# Patient Record
Sex: Female | Born: 1995
Health system: Southern US, Community
[De-identification: ages and names within clinical notes are randomized; demographics above are authoritative.]

## PROBLEM LIST (undated history)

## (undated) DIAGNOSIS — J45909 Unspecified asthma, uncomplicated: Secondary | ICD-10-CM

## (undated) DIAGNOSIS — R51 Headache: Secondary | ICD-10-CM

## (undated) HISTORY — DX: Headache: R51

---
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
## 2015-10-30 ENCOUNTER — Ambulatory Visit: Payer: Self-pay | Admitting: Family Medicine

## 2015-11-02 ENCOUNTER — Ambulatory Visit (INDEPENDENT_AMBULATORY_CARE_PROVIDER_SITE_OTHER): Payer: Medicaid Other | Admitting: Family Medicine

## 2015-11-02 ENCOUNTER — Encounter: Payer: Self-pay | Admitting: Family Medicine

## 2015-11-02 VITALS — BP 114/68 | HR 86 | Temp 97.4°F | Resp 16 | Ht 69.0 in | Wt 171.0 lb

## 2015-11-02 DIAGNOSIS — R635 Abnormal weight gain: Secondary | ICD-10-CM | POA: Diagnosis not present

## 2015-11-02 DIAGNOSIS — J4520 Mild intermittent asthma, uncomplicated: Secondary | ICD-10-CM | POA: Diagnosis not present

## 2015-11-02 DIAGNOSIS — R5383 Other fatigue: Secondary | ICD-10-CM | POA: Diagnosis not present

## 2015-11-02 LAB — COMPLETE METABOLIC PANEL WITH GFR
ALBUMIN: 4.1 g/dL (ref 3.6–5.1)
ALK PHOS: 91 U/L (ref 47–176)
ALT: 7 U/L (ref 5–32)
AST: 16 U/L (ref 12–32)
BILIRUBIN TOTAL: 0.3 mg/dL (ref 0.2–1.1)
BUN: 12 mg/dL (ref 7–20)
CALCIUM: 9.6 mg/dL (ref 8.9–10.4)
CO2: 27 mmol/L (ref 20–31)
Chloride: 100 mmol/L (ref 98–110)
Creat: 0.65 mg/dL (ref 0.50–1.00)
GFR, Est African American: >89 mL/min (ref >=60)
Glucose, Bld: 117 mg/dL — ABNORMAL HIGH (ref 65–99)
POTASSIUM: 4 mmol/L (ref 3.8–5.1)
Sodium: 137 mmol/L (ref 135–146)
TOTAL PROTEIN: 7.7 g/dL (ref 6.3–8.2)

## 2015-11-02 LAB — HEMOGLOBIN A1C
HEMOGLOBIN A1C: 5.2 % (ref <5.7)
Mean Plasma Glucose: 103 mg/dL (ref <117)

## 2015-11-02 LAB — POCT URINALYSIS DIP (DEVICE)
Bilirubin Urine: NEGATIVE
Glucose, UA: NEGATIVE mg/dL
Hgb urine dipstick: NEGATIVE
Ketones, ur: NEGATIVE mg/dL
LEUKOCYTES UA: NEGATIVE
Nitrite: NEGATIVE
Protein, ur: 30 mg/dL — AB
Specific Gravity, Urine: 1.025 (ref 1.005–1.030)
Urobilinogen, UA: 0.2 mg/dL (ref 0.0–1.0)
pH: 7 (ref 5.0–8.0)

## 2015-11-02 LAB — CBC WITH DIFFERENTIAL/PLATELET
BASOS ABS: 0 10*3/uL (ref 0.0–0.1)
Basophils Relative: 0 % (ref 0–1)
Eosinophils Absolute: 0.1 10*3/uL (ref 0.0–0.7)
Eosinophils Relative: 1 % (ref 0–5)
HEMATOCRIT: 37.5 % (ref 36.0–46.0)
Hemoglobin: 11.5 g/dL — ABNORMAL LOW (ref 12.0–15.0)
LYMPHS ABS: 1.8 10*3/uL (ref 0.7–4.0)
LYMPHS PCT: 28 % (ref 12–46)
MCH: 27.8 pg (ref 26.0–34.0)
MCHC: 30.7 g/dL (ref 30.0–36.0)
MCV: 90.6 fL (ref 78.0–100.0)
MPV: 11.9 fL (ref 8.6–12.4)
Monocytes Absolute: 0.4 10*3/uL (ref 0.1–1.0)
Monocytes Relative: 6 % (ref 3–12)
NEUTROS PCT: 65 % (ref 43–77)
Neutro Abs: 4.2 10*3/uL (ref 1.7–7.7)
Platelets: 330 10*3/uL (ref 150–400)
RBC: 4.14 MIL/uL (ref 3.87–5.11)
RDW: 13.1 % (ref 11.5–15.5)
WBC: 6.5 10*3/uL (ref 4.0–10.5)

## 2015-11-02 MED ORDER — ALBUTEROL SULFATE HFA 108 (90 BASE) MCG/ACT IN AERS: 2.0000 | INHALATION_SPRAY | Freq: Four times a day (QID) | RESPIRATORY_TRACT | Status: DC | PRN

## 2015-11-02 NOTE — Patient Instructions (Addendum)
Fatigue Fatigue is feeling tired all of the time, a lack of energy, or a lack of motivation. Occasional or mild fatigue is often a normal response to activity or life in general. However, long-lasting (chronic) or extreme fatigue may indicate an underlying medical condition. HOME CARE INSTRUCTIONS  Watch your fatigue for any changes. The following actions may help to lessen any discomfort you are feeling:  Talk to your health care provider about how much sleep you need each night. Try to get the required amount every night.  Take medicines only as directed by your health care provider.  Eat a healthy and nutritious diet. Ask your health care provider if you need help changing your diet.  Drink enough fluid to keep your urine clear or pale yellow.  Practice ways of relaxing, such as yoga, meditation, massage therapy, or acupuncture.  Exercise regularly.   Change situations that cause you stress. Try to keep your work and personal routine reasonable.  Do not abuse illegal drugs.  Limit alcohol intake to no more than 1 drink per day for nonpregnant women and 2 drinks per day for men. One drink equals 12 ounces of beer, 5 ounces of wine, or 1 ounces of hard liquor.  Take a multivitamin, if directed by your health care provider. SEEK MEDICAL CARE IF:   Your fatigue does not get better.  You have a fever.   You have unintentional weight loss or gain.  You have headaches.   You have difficulty:   Falling asleep.  Sleeping throughout the night.  You feel angry, guilty, anxious, or sad.   You are unable to have a bowel movement (constipation).   You skin is dry.   Your legs or another part of your body is swollen.  SEEK IMMEDIATE MEDICAL CARE IF:   You feel confused.   Your vision is blurry.  You feel faint or pass out.   You have a severe headache.   You have severe abdominal, pelvic, or back pain.   You have chest pain, shortness of breath, or an  irregular or fast heartbeat.   You are unable to urinate or you urinate less than normal.   You develop abnormal bleeding, such as bleeding from the rectum, vagina, nose, lungs, or nipples.  You vomit blood.   You have thoughts about harming yourself or committing suicide.   You are worried that you might harm someone else.    This information is not intended to replace advice given to you by your health care provider. Make sure you discuss any questions you have with your health care provider.   Document Released: 06/01/2007 Document Revised: 08/25/2014 Document Reviewed: 12/06/2013 Elsevier Interactive Patient Education 2016 Elsevier Inc. Medroxyprogesterone injection [Contraceptive] What is this medicine? MEDROXYPROGESTERONE (me DROX ee proe JES te rone) contraceptive injections prevent pregnancy. They provide effective birth control for 3 months. Depo-subQ Provera 104 is also used for treating pain related to endometriosis. This medicine may be used for other purposes; ask your health care provider or pharmacist if you have questions. What should I tell my health care provider before I take this medicine? They need to know if you have any of these conditions: -frequently drink alcohol -asthma -blood vessel disease or a history of a blood clot in the lungs or legs -bone disease such as osteoporosis -breast cancer -diabetes -eating disorder (anorexia nervosa or bulimia) -high blood pressure -HIV infection or AIDS -kidney disease -liver disease -mental depression -migraine -seizures (convulsions) -stroke -tobacco smoker -vaginal  bleeding -an unusual or allergic reaction to medroxyprogesterone, other hormones, medicines, foods, dyes, or preservatives -pregnant or trying to get pregnant -breast-feeding How should I use this medicine? Depo-Provera Contraceptive injection is given into a muscle. Depo-subQ Provera 104 injection is given under the skin. These injections  are given by a health care professional. You must not be pregnant before getting an injection. The injection is usually given during the first 5 days after the start of a menstrual period or 6 weeks after delivery of a baby. Talk to your pediatrician regarding the use of this medicine in children. Special care may be needed. These injections have been used in female children who have started having menstrual periods. Overdosage: If you think you have taken too much of this medicine contact a poison control center or emergency room at once. NOTE: This medicine is only for you. Do not share this medicine with others. What if I miss a dose? Try not to miss a dose. You must get an injection once every 3 months to maintain birth control. If you cannot keep an appointment, call and reschedule it. If you wait longer than 13 weeks between Depo-Provera contraceptive injections or longer than 14 weeks between Depo-subQ Provera 104 injections, you could get pregnant. Use another method for birth control if you miss your appointment. You may also need a pregnancy test before receiving another injection. What may interact with this medicine? Do not take this medicine with any of the following medications: -bosentan This medicine may also interact with the following medications: -aminoglutethimide -antibiotics or medicines for infections, especially rifampin, rifabutin, rifapentine, and griseofulvin -aprepitant -barbiturate medicines such as phenobarbital or primidone -bexarotene -carbamazepine -medicines for seizures like ethotoin, felbamate, oxcarbazepine, phenytoin, topiramate -modafinil -St. John's wort This list may not describe all possible interactions. Give your health care provider a list of all the medicines, herbs, non-prescription drugs, or dietary supplements you use. Also tell them if you smoke, drink alcohol, or use illegal drugs. Some items may interact with your medicine. What should I watch  for while using this medicine? This drug does not protect you against HIV infection (AIDS) or other sexually transmitted diseases. Use of this product may cause you to lose calcium from your bones. Loss of calcium may cause weak bones (osteoporosis). Only use this product for more than 2 years if other forms of birth control are not right for you. The longer you use this product for birth control the more likely you will be at risk for weak bones. Ask your health care professional how you can keep strong bones. You may have a change in bleeding pattern or irregular periods. Many females stop having periods while taking this drug. If you have received your injections on time, your chance of being pregnant is very low. If you think you may be pregnant, see your health care professional as soon as possible. Tell your health care professional if you want to get pregnant within the next year. The effect of this medicine may last a long time after you get your last injection. What side effects may I notice from receiving this medicine? Side effects that you should report to your doctor or health care professional as soon as possible: -allergic reactions like skin rash, itching or hives, swelling of the face, lips, or tongue -breast tenderness or discharge -breathing problems -changes in vision -depression -feeling faint or lightheaded, falls -fever -pain in the abdomen, chest, groin, or leg -problems with balance, talking, walking -unusually weak or  tired -yellowing of the eyes or skin Side effects that usually do not require medical attention (report to your doctor or health care professional if they continue or are bothersome): -acne -fluid retention and swelling -headache -irregular periods, spotting, or absent periods -temporary pain, itching, or skin reaction at site where injected -weight gain This list may not describe all possible side effects. Call your doctor for medical advice about  side effects. You may report side effects to FDA at 1-800-FDA-1088. Where should I keep my medicine? This does not apply. The injection will be given to you by a health care professional. NOTE: This sheet is a summary. It may not cover all possible information. If you have questions about this medicine, talk to your doctor, pharmacist, or health care provider.    2016, Elsevier/Gold Standard. (2008-08-25 18:37:56)

## 2015-11-02 NOTE — Progress Notes (Signed)
Subjective:    Patient ID: Julie Moody, female    DOB: 10/08/1995, 20 y.o.   MRN: [id number]  HPI  Ms. Ailene ArdsLaura Morina, a 20 year old patient that presents to establish care. She says that she was a patient [facility]. She states that she has not been followed in greater than 1 year. 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   Patient is currently complaining of fatigue.  Symptoms began several weeks ago. Sentinal symptom the patient feels fatigue began with: weight gain of 15 pounds. Symptoms of her fatigue have been intermittent. Patient describes the following psychologic symptoms: stress at school. Symptoms have been intermittent. Severity has been symptoms bothersome, but easily able to carry out all usual work/school/family activities.   Patient also has a history of asthma. presents for evaluation of asthma. Patient states that she has not had an asthma exacerbation since high school. Patient is currently asymptomatic. . Medications used in the past to treat these symptoms include beta agonist inhalers. Suspected precipitants include exercise.  Past Medical History  Diagnosis Date  . Headache(784.0)     Immunization History  Administered Date(s) Administered  . Influenza,inj,Quad PF,36+ Mos 09/07/2014    Social History   Social History  . Marital Status: Single    Spouse Name: N/A  . Number of Children: N/A  . Years of Education: N/A   Occupational History  . Not on file.   Social History Main Topics  . Smoking status: Never Smoker   . Smokeless tobacco: Never Used  . Alcohol Use: No  . Drug Use: No  . Sexual Activity:    Partners: Male   Other Topics Concern  . Not on file   Social History Narrative   Review of Systems  Constitutional: Negative.   HENT: Negative.   Eyes: Negative.   Respiratory: Negative.   Cardiovascular: Negative.   Gastrointestinal: Negative.    Endocrine: Negative.  Negative for polydipsia, polyphagia and polyuria.  Genitourinary: Negative.   Musculoskeletal: Negative.   Skin: Negative.   Allergic/Immunologic: Negative.   Neurological: Negative.   Hematological: Negative.   Psychiatric/Behavioral: Negative.       Objective:   Physical Exam  Constitutional: She is oriented to person, place, and time. She appears well-developed and well-nourished.  HENT:  Head: Normocephalic and atraumatic.  Right Ear: External ear normal.  Left Ear: External ear normal.  Mouth/Throat: Oropharynx is clear and moist.  Eyes: Conjunctivae and EOM are normal. Pupils are equal, round, and reactive to light.  Neck: Normal range of motion. Neck supple.  Cardiovascular: Normal rate, regular rhythm, normal heart sounds and intact distal pulses.   Pulmonary/Chest: Effort normal and breath sounds normal.  Abdominal: Soft. Bowel sounds are normal.  Musculoskeletal: Normal range of motion.  Neurological: She is alert and oriented to person, place, and time. She has normal reflexes.  Skin: Skin is warm and dry.  Psychiatric: She has a normal mood and affect. Her behavior is normal. Judgment and thought content normal.     BP 114/68 mmHg  Pulse 86  Temp(Src) 97.4 F (36.3 C) (Oral)  Resp 16  Ht 5\' 9"  (1.753 m)  Wt 171 lb (77.565 kg)  BMI 25.24 kg/m2  LMP 10/19/2015 Assessment & Plan:  1. Weight gain Recommend a lowfat, low carbohydrate diet divided over 5-6 small meals, increase water intake to 6-8 glasses, and 150 minutes per week of cardiovascular exercise.   -  Hemoglobin A1c  2. Other fatigue Discussed the importance of establishing a sleep routine and a balanced diet. She is also experiencing increased stress related to midterms.  - POCT urinalysis dipstick - CBC with Differential - COMPLETE METABOLIC PANEL WITH GFR  3. Asthma, mild intermittent, uncomplicated - albuterol (PROVENTIL HFA;VENTOLIN HFA) 108 (90 Base) MCG/ACT inhaler;  Inhale 2 puffs into the lungs every 6 (six) hours as needed for wheezing or shortness of breath.  Dispense: 1 Inhaler; Refill: 0  Routine Health Maintenance:  Routine dental visit Yearly opthalmology examination Will review immunizations on [facility] Recommend monthly self-breast examinations Patient has an appointment scheduled with gynecologist to start depo provera.    RTC: Will follow up by phone with laboratory results.    Massie Maroon, FNP

## 2015-11-05 ENCOUNTER — Telehealth: Payer: Self-pay

## 2015-11-05 NOTE — Telephone Encounter (Signed)
-----   Message from Massie MaroonLachina M Hollis, [place] sent at 11/04/2015  2:17 PM EDT ----- Regarding: lab results Please inform Ms. Doristine CounterBurnett that she is mildly anemic. Recommend a diet rich in iron such a green leafy veggies, organ meats, and legumes. I recommend that she add a daily multivitamin for women. All other labs are within a normal range.    Thanks  ----- Message -----    From: Lab in [facility] Interface    Sent: 11/02/2015  10:28 PM      To: Massie MaroonLachina M Hollis, FNP

## 2015-11-05 NOTE — Telephone Encounter (Signed)
Called and advised of labs and to eat iron rich sources such as green leafy veggies and organ meats. Advised patient to start multivitamin for women otc. Patient verbalized understanding and had no questions at this time. Thanks!

## 2016-01-17 ENCOUNTER — Ambulatory Visit: Payer: Self-pay | Admitting: Allergy and Immunology

## 2016-07-31 ENCOUNTER — Emergency Department (HOSPITAL_COMMUNITY)
Admission: EM | Admit: 2016-07-31 | Discharge: 2016-07-31 | Disposition: A | Discharge status: Home / Self Care | Payer: BLUE CROSS/BLUE SHIELD | Attending: Emergency Medicine | Admitting: Emergency Medicine

## 2016-07-31 ENCOUNTER — Encounter (HOSPITAL_COMMUNITY): Payer: Self-pay | Admitting: Emergency Medicine

## 2016-07-31 ENCOUNTER — Emergency Department (HOSPITAL_COMMUNITY): Payer: BLUE CROSS/BLUE SHIELD

## 2016-07-31 DIAGNOSIS — Z79899 Other long term (current) drug therapy: Secondary | ICD-10-CM | POA: Diagnosis not present

## 2016-07-31 DIAGNOSIS — J45901 Unspecified asthma with (acute) exacerbation: Secondary | ICD-10-CM | POA: Insufficient documentation

## 2016-07-31 DIAGNOSIS — R062 Wheezing: Secondary | ICD-10-CM | POA: Diagnosis present

## 2016-07-31 HISTORY — DX: Unspecified asthma, uncomplicated: J45.909

## 2016-07-31 LAB — I-STAT CHEM 8, ED
BUN: 5 mg/dL — AB (ref 6–20)
CALCIUM ION: 1.18 mmol/L (ref 1.15–1.40)
CHLORIDE: 103 mmol/L (ref 101–111)
Creatinine, Ser: 0.7 mg/dL (ref 0.44–1.00)
GLUCOSE: 86 mg/dL (ref 65–99)
HCT: 33 % — ABNORMAL LOW (ref 36.0–46.0)
Hemoglobin: 11.2 g/dL — ABNORMAL LOW (ref 12.0–15.0)
Potassium: 3.2 mmol/L — ABNORMAL LOW (ref 3.5–5.1)
Sodium: 139 mmol/L (ref 135–145)
TCO2: 24 mmol/L (ref 0–100)

## 2016-07-31 LAB — I-STAT TROPONIN, ED: Troponin i, poc: 0 ng/mL (ref 0.00–0.08)

## 2016-07-31 LAB — I-STAT BETA HCG BLOOD, ED (MC, WL, AP ONLY): I-stat hCG, quantitative: <5 m[IU]/mL (ref <5)

## 2016-07-31 MED ORDER — PREDNISONE 10 MG PO TABS
40.0000 mg | ORAL_TABLET | Freq: Every day | ORAL | 0 refills | Status: AC
Start: 2016-07-31 — End: 2016-08-04

## 2016-07-31 MED ORDER — ALBUTEROL SULFATE HFA 108 (90 BASE) MCG/ACT IN AERS
2.0000 | INHALATION_SPRAY | Freq: Once | RESPIRATORY_TRACT | Status: AC
  Administered 2016-07-31: 2 via RESPIRATORY_TRACT
  Filled 2016-07-31: qty 6.7

## 2016-07-31 MED ORDER — IPRATROPIUM-ALBUTEROL 0.5-2.5 (3) MG/3ML IN SOLN
3.0000 mL | RESPIRATORY_TRACT | Status: AC
Start: 2016-07-31 — End: 2016-07-31
  Administered 2016-07-31 (×3): 3 mL via RESPIRATORY_TRACT
  Filled 2016-07-31: qty 6
  Filled 2016-07-31: qty 3

## 2016-07-31 MED ORDER — POTASSIUM CHLORIDE CRYS ER 20 MEQ PO TBCR
40.0000 meq | EXTENDED_RELEASE_TABLET | Freq: Once | ORAL | Status: AC
  Administered 2016-07-31: 40 meq via ORAL
  Filled 2016-07-31: qty 2

## 2016-07-31 MED ORDER — PREDNISONE 20 MG PO TABS
60.0000 mg | ORAL_TABLET | Freq: Once | ORAL | Status: AC
  Administered 2016-07-31: 60 mg via ORAL
  Filled 2016-07-31: qty 3

## 2016-07-31 MED ORDER — FLUTICASONE PROPIONATE 50 MCG/ACT NA SUSP: 2.0000 | Freq: Every day | NASAL | 2 refills | Status: DC

## 2016-07-31 MED ORDER — ALBUTEROL SULFATE (2.5 MG/3ML) 0.083% IN NEBU
5.0000 mg | INHALATION_SOLUTION | Freq: Once | RESPIRATORY_TRACT | Status: AC
  Administered 2016-07-31: 5 mg via RESPIRATORY_TRACT
  Filled 2016-07-31: qty 6

## 2016-07-31 NOTE — ED Triage Notes (Signed)
Pt c/o cough, yellow rhinorrhea, nasal congestion, intermittent CP not r/t cough or movement, SOB onset Sunday, worsening since. 2-pillow orthopnea.

## 2016-07-31 NOTE — ED Provider Notes (Signed)
[facility]-EMERGENCY DEPT Provider Note   CSN: [id number] Arrival date & time: 07/31/16  1137     History   Chief Complaint Chief Complaint  Patient presents with  . Cough  . Shortness of Breath    HPI Julie Moody is a 20 y.o. female.  HPI   Cough, congestion since Monday, productive of yellow sputum, yellow mucus from sinus congestion Today has been the worst of it Progressive worsening of dyspnea and wheezing Hx of asthma, this feels worse Using breathing treatments at night before sleep last night and Tuesday night (her brother's pulmicort), relax enough to go to sleep. This is worse attack she has had.  Has never been admitted for asthma.  Brothers have hx of asthma, and she has intermittent, rx albuterol but she doesn't have daily medicine however will use theirs when things are severe Never been admitted for asthma  Chest pressure, ribs and back pain, worse with cough but there all the time, no leg pain or swelling, is on OCP     Past Medical History:  Diagnosis Date  . Asthma   . WUJWJXBJ(478.2)     Patient Active Problem List   Diagnosis Date Noted  . Other fatigue 11/02/2015  . Weight gain 11/02/2015  . Asthma, mild intermittent 11/02/2015  . Migraine without aura, without mention of intractable migraine without mention of status migrainosus 11/30/2013  . Migraine with aura, without mention of intractable migraine without mention of status migrainosus 11/30/2013    History reviewed. No pertinent surgical history.  OB History    No data available       Home Medications    Prior to Admission medications   Medication Sig Start Date End Date Taking? Authorizing Provider  norethindrone-ethinyl estradiol (JUNEL FE,GILDESS FE,LOESTRIN FE) 1-20 MG-MCG tablet Take 1 tablet by mouth at bedtime.   Yes Historical Provider, MD  fluticasone (FLONASE) 50 MCG/ACT nasal spray Place 2 sprays into both nostrils daily. 07/31/16   Alvira Monday, MD  predniSONE  (DELTASONE) 10 MG tablet Take 4 tablets (40 mg total) by mouth daily. 07/31/16 08/04/16  Alvira Monday, MD    Family History Family History  Problem Relation Age of Onset  . Other Mother     tension-type headaches  . Hypertension Father   . Seizures Brother   . Migraines Cousin   . Hypertension Other     Social History Social History  Substance Use Topics  . Smoking status: Never Smoker  . Smokeless tobacco: Never Used  . Alcohol use No     Allergies   Patient has no known allergies.   Review of Systems Review of Systems  Constitutional: Negative for fever.  HENT: Positive for congestion. Negative for sore throat.   Eyes: Negative for visual disturbance.  Respiratory: Positive for cough and shortness of breath.   Cardiovascular: Positive for chest pain.  Gastrointestinal: Negative for abdominal pain, nausea and vomiting.  Genitourinary: Negative for difficulty urinating.  Musculoskeletal: Negative for back pain and neck pain.  Skin: Negative for rash.  Neurological: Negative for syncope and headaches.     Physical Exam Updated Vital Signs BP 132/68 (BP Location: Right Arm)   Pulse (!) 128 Comment: pt has had several albuterol neb tx  Temp 98.4 F (36.9 C) (Oral)   Resp 18   LMP 07/17/2016   SpO2 100%   Physical Exam  Constitutional: She is oriented to person, place, and time. She appears well-developed and well-nourished. No distress.  HENT:  Head: Normocephalic and  atraumatic.  Mouth/Throat: Oropharynx is clear and moist.  Eyes: Conjunctivae and EOM are normal.  Neck: Normal range of motion.  Cardiovascular: Regular rhythm, normal heart sounds and intact distal pulses.  Tachycardia present.  Exam reveals no gallop and no friction rub.   No murmur heard. Pulmonary/Chest: Effort normal. No respiratory distress. She has decreased breath sounds. She has wheezes (end expiratory). She has no rales.  Frequent cough  Abdominal: Soft. She exhibits no  distension. There is no tenderness. There is no guarding.  Musculoskeletal: She exhibits no edema or tenderness.  Neurological: She is alert and oriented to person, place, and time.  Skin: Skin is warm and dry. No rash noted. She is not diaphoretic. No erythema.  Nursing note and vitals reviewed.    ED Treatments / Results  Labs (all labs ordered are listed, but only abnormal results are displayed) Labs Reviewed  I-STAT CHEM 8, ED - Abnormal; Notable for the following:       Result Value   Potassium 3.2 (*)    BUN 5 (*)    Hemoglobin 11.2 (*)    HCT 33.0 (*)    All other components within normal limits  I-STAT TROPOININ, ED  I-STAT BETA HCG BLOOD, ED ([facility], [facility], [facility] ONLY)  I-STAT TROPOININ, ED    EKG  EKG Interpretation  Date/Time:  Thursday July 31 2016 11:48:49 EST Ventricular Rate:  114 PR Interval:    QRS Duration: 94 QT Interval:  317 QTC Calculation: 437 R Axis:   57 Text Interpretation:  Sinus tachycardia Left atrial enlargement RSR' in V1 or V2, probably normal variant RSR new from prior ECG in 2008 Otherwise no significant change Confirmed by [facility]LOS[facility] MD, Candise Crabtree ([id number]) on 07/31/2016 1:22:11 PM       Radiology Dg Chest 2 View  Result Date: 07/31/2016 CLINICAL DATA:  Four days of shortness of breath and cough. History of asthma. Current smoker. EXAM: CHEST  2 VIEW COMPARISON:  None in PACs FINDINGS: The lungs are mildly hyper inflated. There is no focal infiltrate. There is no pleural effusion or pneumothorax. The heart and pulmonary vascularity are normal. The mediastinum is normal in width. The trachea is midline. The bony thorax exhibits no acute abnormality. IMPRESSION: Mild hyperinflation consistent with known reactive airway disease. No pneumonia, CHF, nor other acute cardiopulmonary abnormality. Electronically Signed   By: David  SwazilandJordan M.D.   On: 07/31/2016 12:19    Procedures Procedures (including critical care time)  Medications Ordered in  ED Medications  albuterol (PROVENTIL) (2.5 MG/3ML) 0.083% nebulizer solution 5 mg (5 mg Nebulization Given 07/31/16 1219)  ipratropium-albuterol (DUONEB) 0.5-2.5 (3) MG/3ML nebulizer solution 3 mL (3 mLs Nebulization Given 07/31/16 1450)  predniSONE (DELTASONE) tablet 60 mg (60 mg Oral Given 07/31/16 1403)  potassium chloride SA (K-DUR,KLOR-CON) CR tablet 40 mEq (40 mEq Oral Given 07/31/16 1611)  albuterol (PROVENTIL HFA;VENTOLIN HFA) 108 (90 Base) MCG/ACT inhaler 2 puff (2 puffs Inhalation Given 07/31/16 1612)     Initial Impression / Assessment and Plan / ED Course  I have reviewed the triage vital signs and the nursing notes.  Pertinent labs & imaging results that were available during my care of the patient were reviewed by me and considered in my medical decision making (see chart for details).  Clinical Course    20 year old female with a history of asthma presents with concern for cough, nasal congestion for days, with progressive wheezing and shortness of breath.  Chest x-ray shows no signs of pneumonia, and shows  mild hyperinflation consistent with asthma. Patient reports chest pain, an EKG which showed sinus tachycardia without other acute changes. Pregnancy test negative, troponin negative, and have low suspicion for myocarditis or other acute coronary disease. Patient without significant anemia. Potassium mildly low and given replacement. Patient tachycardic mildly on arrival, likely secondary to acute illness and dehydration, with tachycardia increasing with albuterol use. Have low suspicion for pulmonary embolus given clinical setting of significant nasal congestion and productive cough, no hypoxia, no leg pain or swelling.    Patient given albuterol, and do an abs 2, with improvement of her shortness of breath. She is speaking in full sentences, comfortable on exam. She was given 60 mg of prednisone.  Patient most likely with viral upper respiratory infection causing asthma  exacerbation, with tachycardia secondary to albuterol use. Encourage by mouth hydration, gave prescription for prednisone for 4 days, as well as prescription for Flonase. Patient discharged in stable condition with understanding of reasons to return.   Final Clinical Impressions(s) / ED Diagnoses   Final diagnoses:  Exacerbation of asthma, unspecified asthma severity, unspecified whether persistent    New Prescriptions Discharge Medication List as of 07/31/2016  4:07 PM    START taking these medications   Details  fluticasone (FLONASE) 50 MCG/ACT nasal spray Place 2 sprays into both nostrils daily., Starting Thu 07/31/2016, Print    predniSONE (DELTASONE) 10 MG tablet Take 4 tablets (40 mg total) by mouth daily., Starting Thu 07/31/2016, Until Mon 08/04/2016, Print         Alvira MondayErin Runa Whittingham, MD 07/31/16 1651

## 2016-08-06 ENCOUNTER — Emergency Department (HOSPITAL_COMMUNITY): Payer: BLUE CROSS/BLUE SHIELD

## 2016-08-06 ENCOUNTER — Emergency Department (HOSPITAL_COMMUNITY)
Admission: EM | Admit: 2016-08-06 | Discharge: 2016-08-06 | Disposition: A | Discharge status: Home / Self Care | Payer: BLUE CROSS/BLUE SHIELD | Attending: Emergency Medicine | Admitting: Emergency Medicine

## 2016-08-06 DIAGNOSIS — J45901 Unspecified asthma with (acute) exacerbation: Secondary | ICD-10-CM | POA: Diagnosis not present

## 2016-08-06 DIAGNOSIS — R0602 Shortness of breath: Secondary | ICD-10-CM | POA: Diagnosis present

## 2016-08-06 MED ORDER — PREDNISONE 20 MG PO TABS: 40.0000 mg | ORAL_TABLET | Freq: Every day | ORAL | 0 refills | Status: AC

## 2016-08-06 MED ORDER — PREDNISONE 20 MG PO TABS: 40.0000 mg | ORAL_TABLET | Freq: Once | ORAL | Status: DC

## 2016-08-06 MED ORDER — ALBUTEROL SULFATE (2.5 MG/3ML) 0.083% IN NEBU: 5.0000 mg | INHALATION_SOLUTION | Freq: Once | RESPIRATORY_TRACT | Status: DC

## 2016-08-06 NOTE — ED Provider Notes (Signed)
[facility]-EMERGENCY DEPT Provider Note   CSN: [id number] Arrival date & time: 08/06/16  [id number]     History   Chief Complaint Chief Complaint  Patient presents with  . Shortness of Breath    HPI Julie Moody is a 20 y.o. female.  The history is provided by the patient.   Patient presents to the emergency department with complaints of shortness of breath.  She has a history of asthma.  She reports her breathing worsened this morning.  She tried several treatments with her handheld albuterol metered-dose inhaler without improvement in her symptoms and thus she contacted EMS.  Patient was given an 125 mg a Solu-Medrol as well as albuterol and Atrovent by EMS.  She feels much better at this time.  She did receive mild nausea in route and was given 4 mg of Zofran.  Arrival emergency present she states she feels much better at this time.  Should a recent asthma exacerbation last week was prescribed prednisone but never filled it.  Reports mild cough without fevers or chills.  She had been feeling well over the past several days.   Past Medical History:  Diagnosis Date  . Asthma   . AOZHYQMV(784.6Headache(784.0)     Patient Active Problem List   Diagnosis Date Noted  . Other fatigue 11/02/2015  . Weight gain 11/02/2015  . Asthma, mild intermittent 11/02/2015  . Migraine without aura, without mention of intractable migraine without mention of status migrainosus 11/30/2013  . Migraine with aura, without mention of intractable migraine without mention of status migrainosus 11/30/2013    No past surgical history on file.  OB History    No data available       Home Medications    Prior to Admission medications   Medication Sig Start Date End Date Taking? Authorizing Provider  norethindrone-ethinyl estradiol (JUNEL FE,GILDESS FE,LOESTRIN FE) 1-20 MG-MCG tablet Take 1 tablet by mouth at bedtime.   Yes Historical Provider, MD  fluticasone (FLONASE) 50 MCG/ACT nasal spray Place 2 sprays into both  nostrils daily. Patient not taking: Reported on 08/06/2016 07/31/16   Alvira MondayErin Schlossman, MD  predniSONE (DELTASONE) 20 MG tablet Take 2 tablets (40 mg total) by mouth daily. 08/07/16 08/11/16  Azalia BilisKevin Tobechukwu Emmick, MD    Family History Family History  Problem Relation Age of Onset  . Other Mother     tension-type headaches  . Hypertension Father   . Seizures Brother   . Migraines Cousin   . Hypertension Other     Social History Social History  Substance Use Topics  . Smoking status: Never Smoker  . Smokeless tobacco: Never Used  . Alcohol use No     Allergies   Patient has no known allergies.   Review of Systems Review of Systems  All other systems reviewed and are negative.    Physical Exam Updated Vital Signs BP 121/61   Pulse 92   Temp 98.2 F (36.8 C) (Oral)   Resp 14   LMP 07/17/2016   SpO2 100%   Physical Exam  Constitutional: She is oriented to person, place, and time. She appears well-developed and well-nourished. No distress.  HENT:  Head: Normocephalic and atraumatic.  Eyes: EOM are normal.  Neck: Normal range of motion.  Cardiovascular: Normal rate, regular rhythm and normal heart sounds.   Pulmonary/Chest: Effort normal and breath sounds normal. She has no wheezes.  Abdominal: Soft. She exhibits no distension. There is no tenderness.  Musculoskeletal: Normal range of motion.  Neurological: She is alert  and oriented to person, place, and time.  Skin: Skin is warm and dry.  Psychiatric: She has a normal mood and affect. Judgment normal.  Nursing note and vitals reviewed.    ED Treatments / Results  Labs (all labs ordered are listed, but only abnormal results are displayed) Labs Reviewed - No data to display  EKG  EKG Interpretation  Date/Time:  Wednesday August 06 2016 07:43:23 EST Ventricular Rate:  110 PR Interval:    QRS Duration: 78 QT Interval:  307 QTC Calculation: 416 R Axis:   63 Text Interpretation:  Sinus tachycardia Probable  left atrial enlargement No significant change was found Confirmed by [facility]  MD, Caryn BeeKEVIN ([id number]) on 08/06/2016 9:03:57 AM       Radiology Dg Chest 2 View  Result Date: 08/06/2016 CLINICAL DATA:  Shortness of breath with wheezing EXAM: CHEST  2 VIEW COMPARISON:  July 31, 2016 FINDINGS: Lungs are clear. Heart size and pulmonary vascularity are normal. No adenopathy. No bone lesions. IMPRESSION: No edema or consolidation. Electronically Signed   By: Bretta BangWilliam  Woodruff III M.D.   On: 08/06/2016 08:48    Procedures Procedures (including critical care time)  Medications Ordered in ED Medications  predniSONE (DELTASONE) tablet 40 mg (not administered)     Initial Impression / Assessment and Plan / ED Course  I have reviewed the triage vital signs and the nursing notes.  Pertinent labs & imaging results that were available during my care of the patient were reviewed by me and considered in my medical decision making (see chart for details).  Clinical Course   Feels much better time my evaluation.  Patient be discharged home with prednisone 4 days.  She understands return the ER for new or worsening symptoms.  She received 125 mg of Solu-Medrol by EMS.  She understands return to the ER for new or worsening symptoms  Final Clinical Impressions(s) / ED Diagnoses   Final diagnoses:  Moderate asthma with exacerbation, unspecified whether persistent    New Prescriptions New Prescriptions   PREDNISONE (DELTASONE) 20 MG TABLET    Take 2 tablets (40 mg total) by mouth daily.     Azalia BilisKevin Shivank Pinedo, MD 08/06/16 [phone]

## 2016-08-06 NOTE — ED Notes (Signed)
Bed: WA02 Expected date:  Expected time:  Means of arrival:  Comments: EMS 20 yo female from work/asthma/wheezing-vomited x 1

## 2016-08-06 NOTE — ED Triage Notes (Addendum)
Pt was at work when she became SOB. Pt used inhaler with no relief. Pt mom took her to the closest fire station where they started a neb tx and pt had no relief. EMS states pt had expiratory wheezing before they administered 10 mg albuterol, 0.5mg  atrovent, 125mg  of solu-medro, and 4 mg of zofran intravenously. On arrival EMS reports that pt has decreased SOB and decreased wheezing.   Pt vital with EMS 132/80 120 100 spO2 with neb 20 respirations

## 2017-01-07 ENCOUNTER — Encounter: Payer: Self-pay | Admitting: Family Medicine

## 2017-01-07 ENCOUNTER — Ambulatory Visit (INDEPENDENT_AMBULATORY_CARE_PROVIDER_SITE_OTHER): Payer: BLUE CROSS/BLUE SHIELD | Admitting: Family Medicine

## 2017-01-07 VITALS — BP 134/83 | HR 96 | Temp 98.3°F | Resp 16 | Ht 69.0 in | Wt 159.0 lb

## 2017-01-07 DIAGNOSIS — R0982 Postnasal drip: Secondary | ICD-10-CM | POA: Diagnosis not present

## 2017-01-07 DIAGNOSIS — R5383 Other fatigue: Secondary | ICD-10-CM | POA: Diagnosis not present

## 2017-01-07 DIAGNOSIS — R059 Cough, unspecified: Secondary | ICD-10-CM

## 2017-01-07 DIAGNOSIS — R0981 Nasal congestion: Secondary | ICD-10-CM | POA: Diagnosis not present

## 2017-01-07 DIAGNOSIS — R05 Cough: Secondary | ICD-10-CM

## 2017-01-07 DIAGNOSIS — R52 Pain, unspecified: Secondary | ICD-10-CM | POA: Diagnosis not present

## 2017-01-07 MED ORDER — CHLORPHEN-PE-ACETAMINOPHEN 4-10-325 MG PO TABS: 1.0000 | ORAL_TABLET | Freq: Four times a day (QID) | ORAL | 1 refills | Status: DC | PRN

## 2017-01-07 MED ORDER — FLUTICASONE PROPIONATE 50 MCG/ACT NA SUSP: 2.0000 | Freq: Every day | NASAL | 2 refills | Status: DC

## 2017-01-07 NOTE — Patient Instructions (Addendum)
Upper Respiratory Infection, Adult Most upper respiratory infections (URIs) are caused by a virus. A URI affects the nose, throat, and upper air passages. The most common type of URI is often called "the common cold." Follow these instructions at home:  Take medicines only as told by your doctor.  Gargle warm saltwater or take cough drops to comfort your throat as told by your doctor.  Use a warm mist humidifier or inhale steam from a shower to increase air moisture. This may make it easier to breathe.  Drink enough fluid to keep your pee (urine) clear or pale yellow.  Eat soups and other clear broths.  Have a healthy diet.  Rest as needed.  Go back to work when your fever is gone or your doctor says it is okay.  You may need to stay home longer to avoid giving your URI to others.  You can also wear a face mask and wash your hands often to prevent spread of the virus.  Use your inhaler more if you have asthma.  Do not use any tobacco products, including cigarettes, chewing tobacco, or electronic cigarettes. If you need help quitting, ask your doctor. Contact a doctor if:  You are getting worse, not better.  Your symptoms are not helped by medicine.  You have chills.  You are getting more short of breath.  You have brown or red mucus.  You have yellow or brown discharge from your nose.  You have pain in your face, especially when you bend forward.  You have a fever.  You have puffy (swollen) neck glands.  You have pain while swallowing.  You have white areas in the back of your throat. Get help right away if:  You have very bad or constant:  Headache.  Ear pain.  Pain in your forehead, behind your eyes, and over your cheekbones (sinus pain).  Chest pain.  You have long-lasting (chronic) lung disease and any of the following:  Wheezing.  Long-lasting cough.  Coughing up blood.  A change in your usual mucus.  You have a stiff neck.  You have  changes in your:  Vision.  Hearing.  Thinking.  Mood. This information is not intended to replace advice given to you by your health care provider. Make sure you discuss any questions you have with your health care provider. Document Released: 01/21/2008 Document Revised: 04/06/2016 Document Reviewed: 11/09/2013 Elsevier Interactive Patient Education  2017 [employer] Inc[employer]  Upper Respiratory Infection, Adult Most upper respiratory infections (URIs) are caused by a virus. A URI affects the nose, throat, and upper air passages. The most common type of URI is often called "the common cold." Follow these instructions at home:  Take medicines only as told by your doctor.  Gargle warm saltwater or take cough drops to comfort your throat as told by your doctor.  Use a warm mist humidifier or inhale steam from a shower to increase air moisture. This may make it easier to breathe.  Drink enough fluid to keep your pee (urine) clear or pale yellow.  Eat soups and other clear broths.  Have a healthy diet.  Rest as needed.  Go back to work when your fever is gone or your doctor says it is okay.  You may need to stay home longer to avoid giving your URI to others.  You can also wear a face mask and wash your hands often to prevent spread of the virus.  Use your inhaler more if you have asthma.  Do not use any tobacco products, including cigarettes, chewing tobacco, or electronic cigarettes. If you need help quitting, ask your doctor. Contact a doctor if:  You are getting worse, not better.  Your symptoms are not helped by medicine.  You have chills.  You are getting more short of breath.  You have brown or red mucus.  You have yellow or brown discharge from your nose.  You have pain in your face, especially when you bend forward.  You have a fever.  You have puffy (swollen) neck glands.  You have pain while swallowing.  You have white areas in the back of your  throat. Get help right away if:  You have very bad or constant:  Headache.  Ear pain.  Pain in your forehead, behind your eyes, and over your cheekbones (sinus pain).  Chest pain.  You have long-lasting (chronic) lung disease and any of the following:  Wheezing.  Long-lasting cough.  Coughing up blood.  A change in your usual mucus.  You have a stiff neck.  You have changes in your:  Vision.  Hearing.  Thinking.  Mood. This information is not intended to replace advice given to you by your health care provider. Make sure you discuss any questions you have with your health care provider. Document Released: 01/21/2008 Document Revised: 04/06/2016 Document Reviewed: 11/09/2013 Elsevier Interactive Patient Education  2017 [employer].

## 2017-01-07 NOTE — Progress Notes (Signed)
URI   This is a new problem. The current episode started in the past 7 days. There has been no fever. The fever has been present for 3 to 4 days. Associated symptoms include congestion, coughing, ear pain, headaches, joint pain, a plugged ear sensation and sinus pain. Pertinent negatives include no rash, rhinorrhea or sore throat. She has tried decongestant for the symptoms. The treatment provided no relief.   Past Medical History:  Diagnosis Date  . Asthma   . Headache(784.0)    Social History   Social History  . Marital status: Single    Spouse name: N/A  . Number of children: N/A  . Years of education: N/A   Occupational History  . Not on file.   Social History Main Topics  . Smoking status: Never Smoker  . Smokeless tobacco: Never Used  . Alcohol use 0.0 oz/week     Comment: occ  . Drug use: Yes    Types: Marijuana     Comment: occ  . Sexual activity: Not Currently    Partners: Male   Other Topics Concern  . Not on file   Social History Narrative  . No narrative on file     Review of Systems  Constitutional: Positive for malaise/fatigue. Negative for chills and fever.  HENT: Positive for congestion, ear pain and sinus pain. Negative for rhinorrhea and sore throat.   Eyes: Negative.   Respiratory: Positive for cough.   Cardiovascular: Negative.   Gastrointestinal: Negative.   Genitourinary: Negative.   Musculoskeletal: Positive for joint pain.  Skin: Negative for rash.  Neurological: Positive for headaches. Negative for weakness.  Endo/Heme/Allergies: Negative.   Psychiatric/Behavioral: Negative.     Physical Exam  Constitutional: She is oriented to person, place, and time. She appears well-developed and well-nourished. No distress.  HENT:  Head: Normocephalic and atraumatic.  Right Ear: External ear normal.  Left Ear: External ear normal.  Nose: Mucosal edema and rhinorrhea present. Right sinus exhibits no maxillary sinus tenderness and no frontal sinus  tenderness. Left sinus exhibits no maxillary sinus tenderness and no frontal sinus tenderness.  Cardiovascular: Normal rate and regular rhythm.   Pulmonary/Chest: Effort normal and breath sounds normal. No respiratory distress. She has no wheezes. She has no rales. She exhibits no tenderness.  Abdominal: Soft. Bowel sounds are normal.  Neurological: She is alert and oriented to person, place, and time.  Skin: Skin is warm and dry.   Plan   1. Fatigue, unspecified type Increase rest, hand washing, and fluid intake.   2. Post-nasal drip - Chlorphen-PE-Acetaminophen (NOREL AD) 4-10-325 MG TABS; Take 1 tablet by mouth every 6 (six) hours as needed.  Dispense: 20 tablet; Refill: 1  3. Nasal congestion - Chlorphen-PE-Acetaminophen (NOREL AD) 4-10-325 MG TABS; Take 1 tablet by mouth every 6 (six) hours as needed.  Dispense: 20 tablet; Refill: 1 - fluticasone (FLONASE) 50 MCG/ACT nasal spray; Place 2 sprays into both nostrils daily.  Dispense: 9.9 g; Refill: 2  4. Body aches - Chlorphen-PE-Acetaminophen (NOREL AD) 4-10-325 MG TABS; Take 1 tablet by mouth every 6 (six) hours as needed.  Dispense: 20 tablet; Refill: 1  5. Cough - Chlorphen-PE-Acetaminophen (NOREL AD) 4-10-325 MG TABS; Take 1 tablet by mouth every 6 (six) hours as needed.  Dispense: 20 tablet; Refill: 1   The patient was given clear instructions to go to ER or return to medical center if symptoms do not improve, worsen or new problems develop. The patient verbalized understanding.   Eugenie NorrieLaChina Moore  [facility]  MSN, [place] [address] [place], [place] [address] [phone]

## 2017-02-17 ENCOUNTER — Ambulatory Visit (INDEPENDENT_AMBULATORY_CARE_PROVIDER_SITE_OTHER): Payer: BLUE CROSS/BLUE SHIELD | Admitting: Family Medicine

## 2017-02-17 ENCOUNTER — Encounter: Payer: Self-pay | Admitting: Family Medicine

## 2017-02-17 VITALS — BP 132/77 | HR 77 | Temp 98.3°F | Resp 16 | Ht 69.0 in | Wt 163.0 lb

## 2017-02-17 DIAGNOSIS — R109 Unspecified abdominal pain: Secondary | ICD-10-CM | POA: Diagnosis not present

## 2017-02-17 DIAGNOSIS — E739 Lactose intolerance, unspecified: Secondary | ICD-10-CM | POA: Diagnosis not present

## 2017-02-17 DIAGNOSIS — R140 Abdominal distension (gaseous): Secondary | ICD-10-CM

## 2017-02-17 MED ORDER — LACTASE 3000 UNITS PO TABS: 3000.0000 [IU] | ORAL_TABLET | Freq: Three times a day (TID) | ORAL | 0 refills | Status: DC

## 2017-02-17 NOTE — Patient Instructions (Addendum)
Recommend Lactaid 3000 units with meals to prevent bloating and abdominal cramping.  Explained lactose intolerance to patient. Discussed that lactase is responsible for breaking down lactose so that the body can absorb it. Symptoms of lactose intolerance often occur when the body is unable to digest lactose. Recommend digestive enzymes that contain lactose to ensure that foods are fully digested. Recommend that Julie Moody also supplement with probiotics, which is essential  To a lactose intolerance diet.                                           Lactose Intolerance, Adult  Some high-lactose foods to watch out for: Milk and heavy cream  Condensed and evaporated milk  Ice cream  Cottage cheese  Ricotta cheese  Sour cream  Cheese spreads    Lactose is the natural sugar found in milk and milk products, such as cheese and yogurt. Lactose is digested by lactase, an enzyme in your small intestine. Some people do not produce enough lactase to digest lactose. This is called lactose intolerance. Lactose intolerance is different from milk allergy, which is a more serious reaction to the protein in milk. What are the causes? Causes of lactose intolerance may include:  Normal aging. The ability to produce lactase may decline with age, causing lactose intolerance over time.  Being born without the ability to make lactase.  Digestive diseases such as gastroenteritis or inflammatory bowel disease.  Surgery or injuries to your small intestine.  Infection in your intestines.  Certain antibiotic medicines and cancer treatments.  What are the signs or symptoms? Lactose intolerance can cause uncomfortable symptoms. These are likely to occur within 30 minutes to 2 hours after eating or drinking foods containing lactose. Symptoms of lactose intolerance may include:  Nausea.  Diarrhea.  Abdominal cramps or pain.  Bloating.  Gas.  How is this diagnosed? There are several tests your health care  provider can do to diagnose lactose intolerance. These tests include a hydrogen breath test and stool acidity test. How is this treated? No treatment can improve your body's ability to produce lactase. However, your symptoms can be controlled by limiting or avoiding milk products and other sources of lactose and adjusting your diet. Lactose-free milk is often tolerated. Lactose digestion may also be improved by adding lactase drops to regular milk or by taking lactase tablets when dairy products are consumed. Tolerance to lactose is individual. Some people may be able to eat or drink small amounts of products with lactose, while other may need to avoid lactose entirely. Talk to your health care provider about what is best for you. Follow these instructions at home:  Limit or avoidfoods, beverages, and medicines containing lactose as directed by your health care provider.  Read food and medicine labels carefully to avoid products containing lactose, milk solids, casein, or whey.  If you eliminate dairy products, replace the protein, calcium, vitamin D, and other nutrients they contain through other foods. A registered dietitian or your health care provider can help you adjust your diet.  Choose a milk substitute that is fortified with calcium and vitamin D. Be aware that soy milk contains high quality protein, while milks made from nuts or grains contain very little protein.  Use lactase drops or tablets if directed by your health care provider. Contact a health care provider if: You have no relief from your symptoms after eliminating  milk products and other sources of lactose. This information is not intended to replace advice given to you by your health care provider. Make sure you discuss any questions you have with your health care provider. Document Released: 08/04/2005 Document Revised: 01/10/2016 Document Reviewed: 11/04/2013 Elsevier Interactive Patient Education  2018 [employer].

## 2017-02-17 NOTE — Progress Notes (Signed)
Julie ArdsLaura Loja, a 21 year old female presents accompanied by roommate complaining of abdominal bloating, occasional diarrhea and flatulence primarily after eating daily products. She says that symptoms have worsened after eating. She is not having abdominal pain at present. She says that her diet mostly consists of dairy products. She tried weaning herself off of dairy for 1 week and when dairy was reintroduced symptoms returned. She denies heartburn, fever, dysuria, nausea, vomiting, or diarrhea.   Past Medical History:  Diagnosis Date  . Asthma   . Headache(784.0)    Social History   Social History  . Marital status: Single    Spouse name: N/A  . Number of children: N/A  . Years of education: N/A   Occupational History  . Not on file.   Social History Main Topics  . Smoking status: Never Smoker  . Smokeless tobacco: Never Used  . Alcohol use 0.0 oz/week     Comment: occ  . Drug use: Yes    Types: Marijuana     Comment: occ  . Sexual activity: Not Currently    Partners: Male   Other Topics Concern  . Not on file   Social History Narrative  . No narrative on file      Review of Systems  Constitutional: Negative.   HENT: Negative.   Respiratory: Negative.   Cardiovascular: Negative.   Gastrointestinal: Positive for abdominal pain (abdominal cramping and bloating after eating dairy) and nausea.  Genitourinary: Negative.   Musculoskeletal: Negative.   Skin: Negative.   Neurological: Negative.   Endo/Heme/Allergies: Negative.      Physical Exam  HENT:  Head: Normocephalic.  Right Ear: External ear normal.  Mouth/Throat: Oropharynx is clear and moist.  Eyes: Pupils are equal, round, and reactive to light.  Neck: Normal range of motion. Neck supple.  Cardiovascular: Normal rate, regular rhythm, normal heart sounds and intact distal pulses.   Pulmonary/Chest: Effort normal and breath sounds normal.  Abdominal: Soft. Bowel sounds are normal.  Neurological: She is  alert. Gait normal.  Skin: Skin is warm and dry.  Psychiatric: Affect and judgment normal.   Plan   BP 132/77 (BP Location: Left Arm, Patient Position: Sitting, Cuff Size: Normal)   Pulse 77   Temp 98.3 F (36.8 C) (Oral)   Resp 16   Ht 5\' 9"  (1.753 m)   Wt 163 lb (73.9 kg)   LMP 01/21/2017   SpO2 100%   BMI 24.07 kg/m  1. Abdominal bloating - lactase (LACTAID) 3000 units tablet; Take 1 tablet (3,000 Units total) by mouth 3 (three) times daily with meals.  Dispense: 90 tablet; Refill: 0  2. Abdominal cramping - lactase (LACTAID) 3000 units tablet; Take 1 tablet (3,000 Units total) by mouth 3 (three) times daily with meals.  Dispense: 90 tablet; Refill: 0  3. Lactose intolerance in adult Explained lactose intolerance to patient. Discussed that lactase is responsible for breaking down lactose so that the body can absorb it. Symptoms of lactose intolerance often occur when the body is unable to digest lactose. Recommend digestive enzymes that contain lactose to ensure that foods are fully digested. Recommend that Vernona RiegerLaura also supplement with probiotics, which is essential  To a lactose intolerance diet.  - lactase (LACTAID) 3000 units tablet; Take 1 tablet (3,000 Units total) by mouth 3 (three) times daily with meals.  Dispense: 90 tablet; Refill: 0  RTC: December for CPE or as needed   Nolon Nations  MSN, FNP-C [facility] Patient [facility] [address] [place], [place] [address] [phone]

## 2017-06-20 ENCOUNTER — Emergency Department (HOSPITAL_COMMUNITY)
Admission: EM | Admit: 2017-06-20 | Discharge: 2017-06-20 | Disposition: A | Discharge status: Home / Self Care | Payer: BLUE CROSS/BLUE SHIELD | Attending: Emergency Medicine | Admitting: Emergency Medicine

## 2017-06-20 ENCOUNTER — Encounter (HOSPITAL_COMMUNITY): Payer: Self-pay | Admitting: Nurse Practitioner

## 2017-06-20 DIAGNOSIS — J45909 Unspecified asthma, uncomplicated: Secondary | ICD-10-CM | POA: Insufficient documentation

## 2017-06-20 DIAGNOSIS — R1084 Generalized abdominal pain: Secondary | ICD-10-CM | POA: Insufficient documentation

## 2017-06-20 DIAGNOSIS — Z79899 Other long term (current) drug therapy: Secondary | ICD-10-CM | POA: Diagnosis not present

## 2017-06-20 LAB — I-STAT BETA HCG BLOOD, ED (MC, WL, AP ONLY): I-stat hCG, quantitative: <5 m[IU]/mL (ref <5)

## 2017-06-20 LAB — COMPREHENSIVE METABOLIC PANEL
ALBUMIN: 3.7 g/dL (ref 3.5–5.0)
ALT: 9 U/L — AB (ref 14–54)
AST: 17 U/L (ref 15–41)
Alkaline Phosphatase: 71 U/L (ref 38–126)
Anion gap: 7 (ref 5–15)
BILIRUBIN TOTAL: 0.4 mg/dL (ref 0.3–1.2)
BUN: 9 mg/dL (ref 6–20)
CHLORIDE: 104 mmol/L (ref 101–111)
CO2: 25 mmol/L (ref 22–32)
CREATININE: 0.66 mg/dL (ref 0.44–1.00)
Calcium: 9.1 mg/dL (ref 8.9–10.3)
GFR calc Af Amer: >60 mL/min (ref >60)
GLUCOSE: 88 mg/dL (ref 65–99)
POTASSIUM: 3.6 mmol/L (ref 3.5–5.1)
Sodium: 136 mmol/L (ref 135–145)
TOTAL PROTEIN: 7.8 g/dL (ref 6.5–8.1)

## 2017-06-20 LAB — CBC
HEMATOCRIT: 39.1 % (ref 36.0–46.0)
Hemoglobin: 12.5 g/dL (ref 12.0–15.0)
MCH: 29.6 pg (ref 26.0–34.0)
MCHC: 32 g/dL (ref 30.0–36.0)
MCV: 92.4 fL (ref 78.0–100.0)
PLATELETS: 248 10*3/uL (ref 150–400)
RBC: 4.23 MIL/uL (ref 3.87–5.11)
RDW: 12 % (ref 11.5–15.5)
WBC: 7.9 10*3/uL (ref 4.0–10.5)

## 2017-06-20 LAB — URINALYSIS, ROUTINE W REFLEX MICROSCOPIC
BILIRUBIN URINE: NEGATIVE
GLUCOSE, UA: NEGATIVE mg/dL
HGB URINE DIPSTICK: NEGATIVE
KETONES UR: NEGATIVE mg/dL
LEUKOCYTES UA: NEGATIVE
Nitrite: NEGATIVE
PH: 7 (ref 5.0–8.0)
PROTEIN: NEGATIVE mg/dL
Specific Gravity, Urine: 1.021 (ref 1.005–1.030)

## 2017-06-20 LAB — LIPASE, BLOOD: Lipase: 22 U/L (ref 11–51)

## 2017-06-20 NOTE — Discharge Instructions (Signed)
Take acetaminophen (Tylenol) up to 650 mg (this is normally 2 over-the-counter pills) up to every 4-6 hours as needed for pain control. Make sure your other medications do not contain acetaminophen (Read the labels!)  Return to the emergency room for severely worsening abdominal pain, abdominal pain that localizes to a particular area (especially the right lower part of the belly), pain that persists past 8-10 hours, blood in stool or vomit, severe weakness, fainting, or fever.   Maintain hydration by drinking small amounts of clear fluids frequently, then soft diet, and then advance to a solid diet as tolerated. Avoid foods that are spicy, high in fat or dairy.

## 2017-06-20 NOTE — ED Notes (Signed)
Pt verbalizes understanding of d/c paperwork, follow up instructions, and medications. Pt A/O x4, ambulatory. All belongings with patient upon departure.  

## 2017-06-20 NOTE — ED Triage Notes (Signed)
Pt is c/o severe abdominal pain that started 2 hours ago. She reports nausea related to the pain but denies vomiting, diarrhea or constipation. Reports last BM as yesterday.

## 2017-06-20 NOTE — ED Provider Notes (Signed)
[facility]-EMERGENCY DEPT Provider Note   CSN: [id number] Arrival date & time: 06/20/17  1507     History   Chief Complaint Chief Complaint  Patient presents with  . Abdominal Pain   HPI   Blood pressure 127/70, pulse 79, temperature 98.4 F (36.9 C), temperature source Oral, resp. rate 18, last menstrual period 06/06/2017, SpO2 100 %.  Julie Moody is a 21 y.o. female complaining of diffuse abdominal pain starting 2 hours ago, it comes in waves, no associated fever, chills, nausea, vomiting, change in bowel or bladder habits, vaginal discharge.  She states over her entire torso and it radiates around to the back, no pain medication taken prior to arrival.  She states it feels like a period cramp but she finished her period with 1.5 weeks ago.  Past Medical History:  Diagnosis Date  . Asthma   . [id number](478.2)     Patient Active Problem List   Diagnosis Date Noted  . Other fatigue 11/02/2015  . Weight gain 11/02/2015  . Asthma, mild intermittent 11/02/2015  . Migraine without aura, without mention of intractable migraine without mention of status migrainosus 11/30/2013  . Migraine with aura, without mention of intractable migraine without mention of status migrainosus 11/30/2013    History reviewed. No pertinent surgical history.  OB History    No data available       Home Medications    Prior to Admission medications   Medication Sig Start Date End Date Taking? Authorizing Provider  Chlorphen-PE-Acetaminophen (NOREL AD) 4-10-325 MG TABS Take 1 tablet by mouth every 6 (six) hours as needed. Patient not taking: Reported on 02/17/2017 01/07/17   Massie Maroon, FNP  fluticasone [facility]) 50 MCG/ACT nasal spray Place 2 sprays into both nostrils daily. Patient not taking: Reported on 02/17/2017 01/07/17   Massie Maroon, FNP  lactase (LACTAID) 3000 units tablet Take 1 tablet (3,000 Units total) by mouth 3 (three) times daily with meals. 02/17/17    Massie Maroon, FNP  norethindrone-ethinyl estradiol (JUNEL FE,GILDESS FE,LOESTRIN FE) 1-20 MG-MCG tablet Take 1 tablet by mouth at bedtime.    [provider]    Family History Family History  Problem Relation Age of Onset  . Other Mother        tension-type headaches  . Hypertension Father   . Seizures Brother   . Migraines Cousin   . Hypertension Other     Social History Social History  Substance Use Topics  . Smoking status: Never Smoker  . Smokeless tobacco: Never Used  . Alcohol use 0.0 oz/week     Comment: occ     Allergies   Patient has no known allergies.   Review of Systems Review of Systems  A complete review of systems was obtained and all systems are negative except as noted in the HPI and PMH.   Physical Exam Updated Vital Signs BP 127/70 (BP Location: Right Arm)   Pulse 79   Temp 98.4 F (36.9 C) (Oral)   Resp 18   LMP 06/06/2017   SpO2 100%   Physical Exam  Constitutional: She is oriented to person, place, and time. She appears well-developed and well-nourished. No distress.  HENT:  Head: Normocephalic and atraumatic.  Mouth/Throat: Oropharynx is clear and moist.  Eyes: Pupils are equal, round, and reactive to light. Conjunctivae and EOM are normal.  Neck: Normal range of motion.  Cardiovascular: Normal rate, regular rhythm and intact distal pulses.   Pulmonary/Chest: Effort normal and breath  sounds normal.  Abdominal: Soft. She exhibits no distension and no mass. There is no tenderness. There is no rebound and no guarding. No hernia.  Musculoskeletal: Normal range of motion.  Neurological: She is alert and oriented to person, place, and time.  Skin: Capillary refill takes less than 2 seconds. She is not diaphoretic.  Psychiatric: She has a normal mood and affect.  Nursing note and vitals reviewed.    ED Treatments / Results  Labs (all labs ordered are listed, but only abnormal results are displayed) Labs Reviewed    LIPASE, BLOOD  COMPREHENSIVE METABOLIC PANEL  CBC  URINALYSIS, ROUTINE W REFLEX MICROSCOPIC  I-STAT BETA HCG BLOOD, ED ([facility], [facility], [facility] ONLY)    EKG  EKG Interpretation None       Radiology No results found.  Procedures Procedures (including critical care time)  Medications Ordered in ED Medications - No data to display   Initial Impression / Assessment and Plan / ED Course  I have reviewed the triage vital signs and the nursing notes.  Pertinent labs & imaging results that were available during my care of the patient were reviewed by me and considered in my medical decision making (see chart for details).     Vitals:   06/20/17 1519  BP: 127/70  Pulse: 79  Resp: 18  Temp: 98.4 F (36.9 C)  TempSrc: Oral  SpO2: 100%    Julie Moody is 21 y.o. female presenting with acute onset of severe diffuse abdominal pain with no other symptoms, abdominal exam is benign.  Will check urinalysis and basic blood work.  Patient declines pain medication in the ED.  Blood work reassuring with no abnormalities whatsoever.  Discussed results with patient, repeat abdominal exam remains benign.  We have discussed the possibilities of an early appendicitis, I have recommended a watchful waiting approach, and shared decision making patient agrees to this we have had an extensive discussion of return precautions and she verbalized understanding and teach back technique.  Evaluation does not show pathology that would require ongoing emergent intervention or inpatient treatment. Pt is hemodynamically stable and mentating appropriately. Discussed findings and plan with patient/guardian, who agrees with care plan. All questions answered. Return precautions discussed and outpatient follow up given.      Final Clinical Impressions(s) / ED Diagnoses   Final diagnoses:  None    New Prescriptions New Prescriptions   No medications on file     Kaylyn Limisciotta, Glora Hulgan, PA-C 06/20/17 1650     Pricilla LovelessGoldston, Scott, MD 06/21/17 [phone]

## 2017-07-05 IMAGING — CR DG CHEST 2V
2 series · 2 of 2 positions shown · non-contrast
Comparison: July 31, 2016

CLINICAL DATA: Shortness of breath with wheezing

EXAM:
CHEST  2 VIEW

[w chest pa]
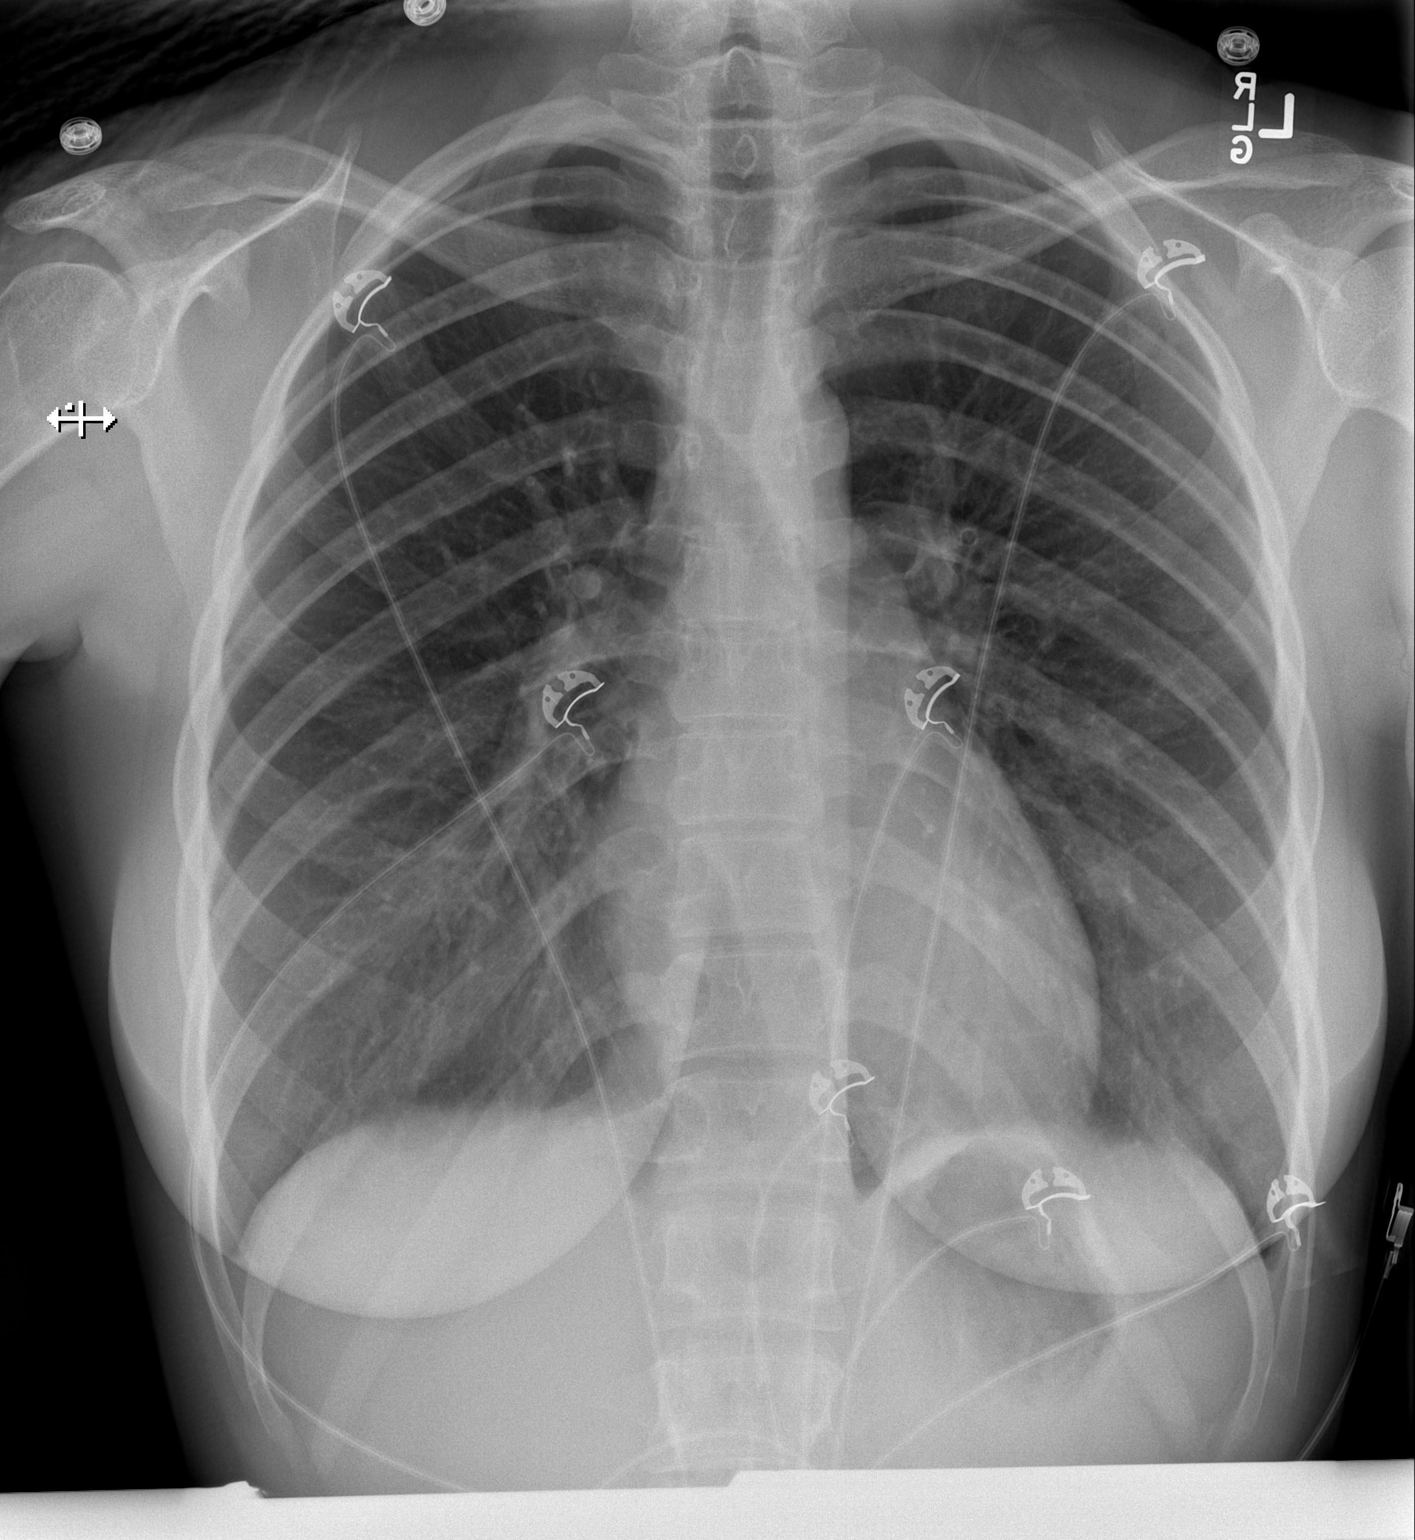

[w chest lat]
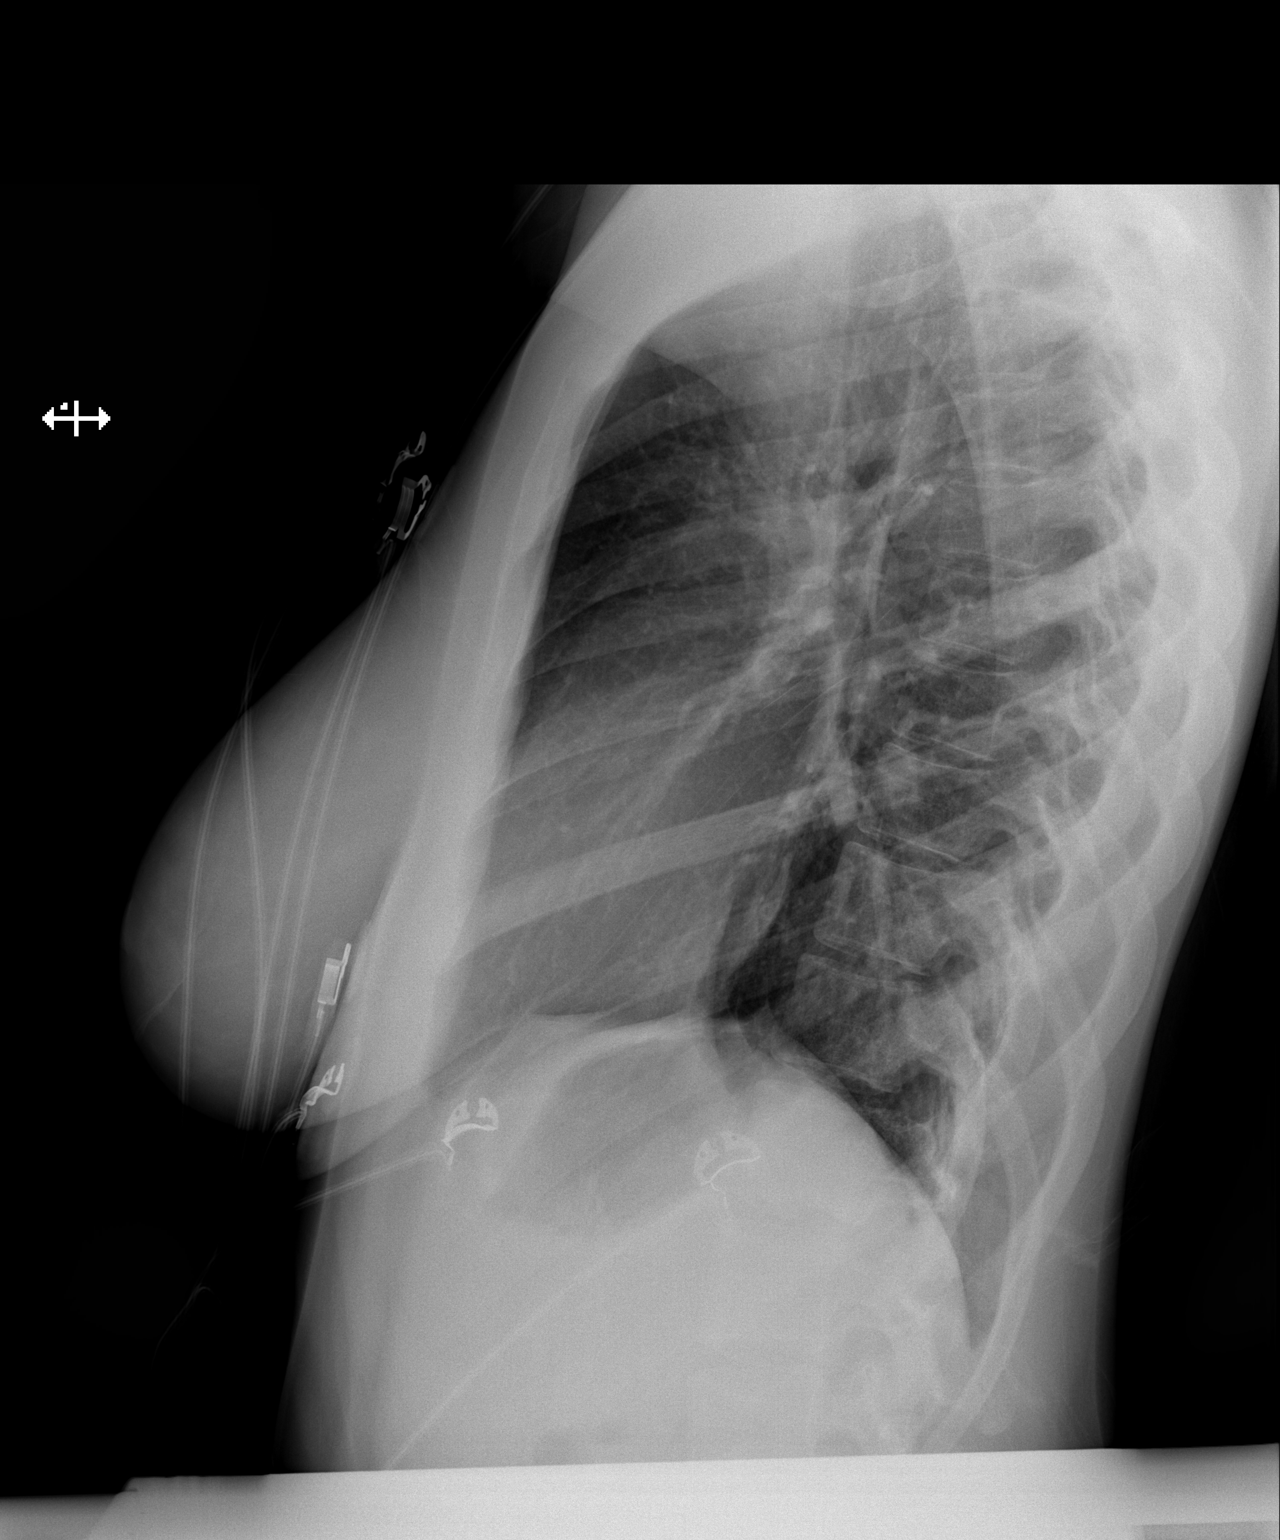

[2 of 2 positions shown; findings below may reference images not displayed]

FINDINGS: Lungs are clear. Heart size and pulmonary vascularity are normal. No
adenopathy. No bone lesions.
IMPRESSION: No edema or consolidation.

## 2017-08-03 ENCOUNTER — Telehealth: Payer: Self-pay | Admitting: Family Medicine

## 2017-08-03 NOTE — Telephone Encounter (Signed)
Patient needs refill on birth control pills. She states that she will run out on Wednesday. She uses the [facility] on [address]. Please advise

## 2017-08-03 NOTE — Telephone Encounter (Signed)
Can we refill this for patient? Please advise. Thanks!

## 2017-08-05 ENCOUNTER — Encounter: Payer: Self-pay | Admitting: Family Medicine

## 2017-08-05 ENCOUNTER — Ambulatory Visit (INDEPENDENT_AMBULATORY_CARE_PROVIDER_SITE_OTHER): Payer: Self-pay | Admitting: Family Medicine

## 2017-08-05 VITALS — BP 132/72 | HR 74 | Temp 98.0°F | Resp 16 | Ht 69.0 in | Wt 174.0 lb

## 2017-08-05 DIAGNOSIS — Z30011 Encounter for initial prescription of contraceptive pills: Secondary | ICD-10-CM

## 2017-08-05 LAB — POCT URINE PREGNANCY: Preg Test, Ur: NEGATIVE

## 2017-08-05 MED ORDER — NORETHIN ACE-ETH ESTRAD-FE 1-20 MG-MCG PO TABS: 1.0000 | ORAL_TABLET | Freq: Every day | ORAL | 5 refills | Status: AC

## 2017-08-05 NOTE — Progress Notes (Signed)
Chief Complaint  Patient presents with  . Contraception    needs bc pills refilled       Julie Moody, a 21 year old female presents for contraception counseling. The patient has no complaints today. The patient says that she is sexually active with barrier protection. Patient denies history of blood clots, urinary tract infection, migraines, or pelvic inflammatory disease.    Past Medical History:  Diagnosis Date  . Asthma   . Headache(784.0)    Social History   Socioeconomic History  . Marital status: Single    Spouse name: Not on file  . Number of children: Not on file  . Years of education: Not on file  . Highest education level: Not on file  Social Needs  . Financial resource strain: Not on file  . Food insecurity - worry: Not on file  . Food insecurity - inability: Not on file  . Transportation needs - medical: Not on file  . Transportation needs - non-medical: Not on file  Occupational History  . Not on file  Tobacco Use  . Smoking status: Never Smoker  . Smokeless tobacco: Never Used  Substance and Sexual Activity  . Alcohol use: Yes    Alcohol/week: 0.0 oz    Comment: occ  . Drug use: Yes    Types: Marijuana    Comment: occ  . Sexual activity: Not Currently    Partners: Male  Other Topics Concern  . Not on file  Social History Narrative  . Not on file   Immunization History  Administered Date(s) Administered  . DTaP 08/26/1996, 10/19/1996, 02/03/1997, 11/16/1997, 04/08/2001  . HPV Quadrivalent 03/03/2008, 05/09/2008, 03/09/2009  . Hepatitis A 08/27/2006, 06/24/2007  . Hepatitis B 12-17-1995, 07/21/1996, 05/09/1997  . HiB (PRP-OMP) 08/26/1996, 10/19/1996, 02/03/1997, 11/16/1997  . IPV 08/26/1996, 10/19/1996, 07/27/1997, 04/08/2001  . Influenza,inj,Quad PF,6+ Mos 09/07/2014  . Influenza-Unspecified 09/07/2014  . MMR 07/27/1997, 04/08/2001  . Meningococcal Conjugate 03/03/2008  . Meningococcal Polysaccharide 08/27/2012  . Tdap 06/24/2007  .  Varicella 07/27/1997  . Zoster 08/27/2006   Review of Systems  Constitutional: Negative.   HENT: Negative.   Eyes: Negative.  Negative for pain, discharge and redness.  Respiratory: Negative.   Cardiovascular: Negative.   Gastrointestinal: Negative.  Negative for blood in stool, constipation and diarrhea.  Genitourinary: Negative.   Musculoskeletal: Negative.   Skin: Negative.   Neurological: Negative.   Psychiatric/Behavioral: Negative.    Physical Exam  Constitutional: She is oriented to person, place, and time. She appears well-developed and well-nourished.  Eyes: Pupils are equal, round, and reactive to light.  Neck: Normal range of motion.  Cardiovascular: Normal rate, regular rhythm, normal heart sounds and intact distal pulses.  Pulmonary/Chest: Effort normal and breath sounds normal.  Abdominal: Soft. Bowel sounds are normal.  Musculoskeletal: Normal range of motion.  Neurological: She is alert and oriented to person, place, and time.  Skin: Skin is warm and dry.  Psychiatric: She has a normal mood and affect. Her behavior is normal. Judgment and thought content normal.  Plan   Oral contraceptive prescribed - POCT urine pregnancy - norethindrone-ethinyl estradiol (JUNEL FE,GILDESS FE,LOESTRIN FE) 1-20 MG-MCG tablet; Take 1 tablet by mouth at bedtime.  Dispense: 1 Package; Refill: 5   RTC: 6 months for contraception management   Donia Pounds  MSN, FNP-C Patient [facility] [address] [place], [place] [address] [phone]

## 2017-08-05 NOTE — Patient Instructions (Signed)
Oral Contraception Information Oral contraceptive pills (OCPs) are medicines taken to prevent pregnancy. OCPs work by preventing the ovaries from releasing eggs. The hormones in OCPs also cause the cervical mucus to thicken, preventing the sperm from entering the uterus. The hormones also cause the uterine lining to become thin, not allowing a fertilized egg to attach to the inside of the uterus. OCPs are highly effective when taken exactly as prescribed. However, OCPs do not prevent sexually transmitted diseases (STDs). Safe sex practices, such as using condoms along with the pill, can help prevent STDs. Before taking the pill, you may have a physical exam and Pap test. Your health care provider may order blood tests. The health care provider will make sure you are a good candidate for oral contraception. Discuss with your health care provider the possible side effects of the OCP you may be prescribed. When starting an OCP, it can take 2 to 3 months for the body to adjust to the changes in hormone levels in your body. Types of oral contraception  The combination pill-This pill contains estrogen and progestin (synthetic progesterone) hormones. The combination pill comes in 21-day, 28-day, or 91-day packs. Some types of combination pills are meant to be taken continuously (365-day pills). With 21-day packs, you do not take pills for 7 days after the last pill. With 28-day packs, the pill is taken every day. The last 7 pills are without hormones. Certain types of pills have more than 21 hormone-containing pills. With 91-day packs, the first 84 pills contain both hormones, and the last 7 pills contain no hormones or contain estrogen only.  The minipill-This pill contains the progesterone hormone only. The pill is taken every day continuously. It is very important to take the pill at the same time each day. The minipill comes in packs of 28 pills. All 28 pills contain the hormone. Advantages of oral  contraceptive pills  Decreases premenstrual symptoms.  Treats menstrual period cramps.  Regulates the menstrual cycle.  Decreases a heavy menstrual flow.  May treatacne, depending on the type of pill.  Treats abnormal uterine bleeding.  Treats polycystic ovarian syndrome.  Treats endometriosis.  Can be used as emergency contraception. Things that can make oral contraceptive pills less effective OCPs can be less effective if:  You forget to take the pill at the same time every day.  You have a stomach or intestinal disease that lessens the absorption of the pill.  You take OCPs with other medicines that make OCPs less effective, such as antibiotics, certain HIV medicines, and some seizure medicines.  You take expired OCPs.  You forget to restart the pill on day 7, when using the packs of 21 pills.  Risks associated with oral contraceptive pills Oral contraceptive pills can sometimes cause side effects, such as:  Headache.  Nausea.  Breast tenderness.  Irregular bleeding or spotting.  Combination pills are also associated with a small increased risk of:  Blood clots.  Heart attack.  Stroke.  This information is not intended to replace advice given to you by your health care provider. Make sure you discuss any questions you have with your health care provider. Document Released: 10/25/2002 Document Revised: 01/10/2016 Document Reviewed: 01/23/2013 Elsevier Interactive Patient Education  2018 [employer]  

## 2017-10-30 ENCOUNTER — Encounter: Payer: Self-pay | Admitting: Family Medicine

## 2017-10-30 ENCOUNTER — Ambulatory Visit (INDEPENDENT_AMBULATORY_CARE_PROVIDER_SITE_OTHER): Payer: BLUE CROSS/BLUE SHIELD | Admitting: Family Medicine

## 2017-10-30 VITALS — BP 136/81 | HR 92 | Temp 98.2°F | Resp 14 | Ht 69.0 in | Wt 173.0 lb

## 2017-10-30 DIAGNOSIS — L739 Follicular disorder, unspecified: Secondary | ICD-10-CM | POA: Diagnosis not present

## 2017-10-30 MED ORDER — KETOCONAZOLE 2 % EX CREA: 1.0000 "application " | TOPICAL_CREAM | Freq: Every day | CUTANEOUS | 0 refills | Status: AC

## 2017-10-30 NOTE — Progress Notes (Signed)
   Subjective:    Patient ID: Julie Moody, female    DOB: 1996/06/22, 22 y.o.   MRN: [id number]  HPI A 22 year old female presents complaining of external groin and vaginal rash present over the past 4 days. Patient typically uses a razor for shaving. She says that rash appeared after shaving. She characterized rash as uncomfortable, dry, and itching. She has not attempted OTC interventions to alleviate symptoms.  Past Medical History:  Diagnosis Date  . Asthma   . Headache(784.0)    Social History   Socioeconomic History  . Marital status: Single    Spouse name: Not on file  . Number of children: Not on file  . Years of education: Not on file  . Highest education level: Not on file  Social Needs  . Financial resource strain: Not on file  . Food insecurity - worry: Not on file  . Food insecurity - inability: Not on file  . Transportation needs - medical: Not on file  . Transportation needs - non-medical: Not on file  Occupational History  . Not on file  Tobacco Use  . Smoking status: Never Smoker  . Smokeless tobacco: Never Used  Substance and Sexual Activity  . Alcohol use: Yes    Alcohol/week: 0.0 oz    Comment: occ  . Drug use: Yes    Types: Marijuana    Comment: occ  . Sexual activity: Not Currently    Partners: Male  Other Topics Concern  . Not on file  Social History Narrative  . Not on file   Immunization History  Administered Date(s) Administered  . DTaP 08/26/1996, 10/19/1996, 02/03/1997, 11/16/1997, 04/08/2001  . HPV Quadrivalent 03/03/2008, 05/09/2008, 03/09/2009  . Hepatitis A 08/27/2006, 06/24/2007  . Hepatitis B 05/18/96, 07/21/1996, 05/09/1997  . HiB (PRP-OMP) 08/26/1996, 10/19/1996, 02/03/1997, 11/16/1997  . IPV 08/26/1996, 10/19/1996, 07/27/1997, 04/08/2001  . Influenza,inj,Quad PF,6+ Mos 09/07/2014  . Influenza-Unspecified 09/07/2014  . MMR 07/27/1997, 04/08/2001  . Meningococcal Conjugate 03/03/2008  . Meningococcal Polysaccharide  08/27/2012  . Tdap 06/24/2007  . Varicella 07/27/1997  . Zoster 08/27/2006      Review of Systems  Constitutional: Negative.   HENT: Negative.   Respiratory: Negative.   Endocrine: Negative.   Genitourinary: Negative.   Skin: Positive for rash (itching).  Allergic/Immunologic: Negative.   Neurological: Negative.   Hematological: Negative.   Psychiatric/Behavioral: Negative.        Objective:   Physical Exam  Cardiovascular: Normal rate, regular rhythm and normal heart sounds.  Pulmonary/Chest: Effort normal and breath sounds normal.  Abdominal: Soft. Bowel sounds are normal.  Skin: Rash noted. Rash is macular.     Dry, macular rash to groin, nondraining, nontender         BP 136/81 (BP Location: Right Arm, Patient Position: Sitting, Cuff Size: Normal)   Pulse 92   Temp 98.2 F (36.8 C) (Oral)   Resp 14   Ht '5\' 9"'$  (1.753 m)   Wt 173 lb (78.5 kg)   LMP 10/12/2017   SpO2 100%   BMI 25.55 kg/m  Assessment & Plan:   Folliculitis I suspect that patient has folliculitis related to shaving. Refrain from shaving irritated skin.  Recommend that she uses electronic razor going forward.  - ketoconazole (NIZORAL) 2 % cream; Apply 1 application topically daily.  Dispense: 15 g; Refill: 0   Donia Pounds  MSN, FNP-C [facility] [address] [place], [place] [address] [phone]

## 2017-10-30 NOTE — Patient Instructions (Signed)
Apply externally for 1 week.  Shave completely with electronic razor (Panosonic)   Folliculitis Folliculitis is inflammation of the hair follicles. Folliculitis most commonly occurs on the scalp, thighs, legs, back, and buttocks. However, it can occur anywhere on the body. What are the causes? This condition may be caused by:  A bacterial infection (common).  A fungal infection.  A viral infection.  Coming into contact with certain chemicals, especially oils and tars.  Shaving or waxing.  Applying greasy ointments or creams to your skin often.  Long-lasting folliculitis and folliculitis that keeps coming back can be caused by bacteria that live in the nostrils. What increases the risk? This condition is more likely to develop in people with:  A weakened immune system.  Diabetes.  Obesity.  What are the signs or symptoms? Symptoms of this condition include:  Redness.  Soreness.  Swelling.  Itching.  Small white or yellow, pus-filled, itchy spots (pustules) that appear over a reddened area. If there is an infection that goes deep into the follicle, these may develop into a boil (furuncle).  A group of closely packed boils (carbuncle). These tend to form in hairy, sweaty areas of the body.  How is this diagnosed? This condition is diagnosed with a skin exam. To find what is causing the condition, your health care provider may take a sample of one of the pustules or boils for testing. How is this treated? This condition may be treated by:  Applying warm compresses to the affected areas.  Taking an antibiotic medicine or applying an antibiotic medicine to the skin.  Applying or bathing with an antiseptic solution.  Taking an over-the-counter medicine to help with itching.  Having a procedure to drain any pustules or boils. This may be done if a pustule or boil contains a lot of pus or fluid.  Laser hair removal. This may be done to treat long-lasting  folliculitis.  Follow these instructions at home:  If directed, apply heat to the affected area as often as told by your health care provider. Use the heat source that your health care provider recommends, such as a moist heat pack or a heating pad. ? Place a towel between your skin and the heat source. ? Leave the heat on for 20-30 minutes. ? Remove the heat if your skin turns bright red. This is especially important if you are unable to feel pain, heat, or cold. You may have a greater risk of getting burned.  If you were prescribed an antibiotic medicine, use it as told by your health care provider. Do not stop using the antibiotic even if you start to feel better.  Take over-the-counter and prescription medicines only as told by your health care provider.  Do not shave irritated skin.  Keep all follow-up visits as told by your health care provider. This is important. Get help right away if:  You have more redness, swelling, or pain in the affected area.  Red streaks are spreading from the affected area.  You have a fever. This information is not intended to replace advice given to you by your health care provider. Make sure you discuss any questions you have with your health care provider. Document Released: 10/13/2001 Document Revised: 02/22/2016 Document Reviewed: 05/25/2015 Elsevier Interactive Patient Education  2018 [employer]

## 2017-11-09 ENCOUNTER — Ambulatory Visit (INDEPENDENT_AMBULATORY_CARE_PROVIDER_SITE_OTHER): Payer: BLUE CROSS/BLUE SHIELD | Admitting: Family Medicine

## 2017-11-09 ENCOUNTER — Encounter: Payer: Self-pay | Admitting: Family Medicine

## 2017-11-09 VITALS — BP 123/67 | HR 83 | Temp 98.5°F | Resp 14 | Ht 69.0 in | Wt 176.0 lb

## 2017-11-09 DIAGNOSIS — M545 Low back pain, unspecified: Secondary | ICD-10-CM

## 2017-11-09 LAB — POCT URINALYSIS DIP (DEVICE)
BILIRUBIN URINE: NEGATIVE
Glucose, UA: NEGATIVE mg/dL
HGB URINE DIPSTICK: NEGATIVE
Ketones, ur: NEGATIVE mg/dL
Leukocytes, UA: NEGATIVE
Nitrite: NEGATIVE
PH: 8.5 — AB (ref 5.0–8.0)
Protein, ur: NEGATIVE mg/dL
Specific Gravity, Urine: 1.02 (ref 1.005–1.030)
Urobilinogen, UA: 0.2 mg/dL (ref 0.0–1.0)

## 2017-11-09 MED ORDER — KETOROLAC TROMETHAMINE 30 MG/ML IJ SOLN
30.0000 mg | Freq: Once | INTRAMUSCULAR | Status: AC
  Administered 2017-11-09: 30 mg via INTRAMUSCULAR

## 2017-11-09 MED ORDER — NAPROXEN 500 MG PO TABS: 500.0000 mg | ORAL_TABLET | Freq: Two times a day (BID) | ORAL | 0 refills | Status: AC

## 2017-11-09 NOTE — Patient Instructions (Addendum)
We will start a trial of naproxen 500 mg twice daily with food.  Also recommend that you complete back exercises daily. It is important that you increase your daily water intake. Back Exercises If you have pain in your back, do these exercises 2-3 times each day or as told by your doctor. When the pain goes away, do the exercises once each day, but repeat the steps more times for each exercise (do more repetitions). If you do not have pain in your back, do these exercises once each day or as told by your doctor. Exercises Single Knee to Chest  Do these steps 3-5 times in a row for each leg: 1. Lie on your back on a firm bed or the floor with your legs stretched out. 2. Bring one knee to your chest. 3. Hold your knee to your chest by grabbing your knee or thigh. 4. Pull on your knee until you feel a gentle stretch in your lower back. 5. Keep doing the stretch for 10-30 seconds. 6. Slowly let go of your leg and straighten it.  Pelvic Tilt  Do these steps 5-10 times in a row: 1. Lie on your back on a firm bed or the floor with your legs stretched out. 2. Bend your knees so they point up to the ceiling. Your feet should be flat on the floor. 3. Tighten your lower belly (abdomen) muscles to press your lower back against the floor. This will make your tailbone point up to the ceiling instead of pointing down to your feet or the floor. 4. Stay in this position for 5-10 seconds while you gently tighten your muscles and breathe evenly.  Cat-Cow  Do these steps until your lower back bends more easily: 1. Get on your hands and knees on a firm surface. Keep your hands under your shoulders, and keep your knees under your hips. You may put padding under your knees. 2. Let your head hang down, and make your tailbone point down to the floor so your lower back is round like the back of a cat. 3. Stay in this position for 5 seconds. 4. Slowly lift your head and make your tailbone point up to the ceiling  so your back hangs low (sags) like the back of a cow. 5. Stay in this position for 5 seconds.  Press-Ups  Do these steps 5-10 times in a row: 1. Lie on your belly (face-down) on the floor. 2. Place your hands near your head, about shoulder-width apart. 3. While you keep your back relaxed and keep your hips on the floor, slowly straighten your arms to raise the top half of your body and lift your shoulders. Do not use your back muscles. To make yourself more comfortable, you may change where you place your hands. 4. Stay in this position for 5 seconds. 5. Slowly return to lying flat on the floor.  Bridges  Do these steps 10 times in a row: 1. Lie on your back on a firm surface. 2. Bend your knees so they point up to the ceiling. Your feet should be flat on the floor. 3. Tighten your butt muscles and lift your butt off of the floor until your waist is almost as high as your knees. If you do not feel the muscles working in your butt and the back of your thighs, slide your feet 1-2 inches farther away from your butt. 4. Stay in this position for 3-5 seconds. 5. Slowly lower your butt to the floor,  and let your butt muscles relax.  If this exercise is too easy, try doing it with your arms crossed over your chest. Belly Crunches  Do these steps 5-10 times in a row: 1. Lie on your back on a firm bed or the floor with your legs stretched out. 2. Bend your knees so they point up to the ceiling. Your feet should be flat on the floor. 3. Cross your arms over your chest. 4. Tip your chin a little bit toward your chest but do not bend your neck. 5. Tighten your belly muscles and slowly raise your chest just enough to lift your shoulder blades a tiny bit off of the floor. 6. Slowly lower your chest and your head to the floor.  Back Lifts Do these steps 5-10 times in a row: 1. Lie on your belly (face-down) with your arms at your sides, and rest your forehead on the floor. 2. Tighten the muscles  in your legs and your butt. 3. Slowly lift your chest off of the floor while you keep your hips on the floor. Keep the back of your head in line with the curve in your back. Look at the floor while you do this. 4. Stay in this position for 3-5 seconds. 5. Slowly lower your chest and your face to the floor.  Contact a doctor if:  Your back pain gets a lot worse when you do an exercise.  Your back pain does not lessen 2 hours after you exercise. If you have any of these problems, stop doing the exercises. Do not do them again unless your doctor says it is okay. Get help right away if:  You have sudden, very bad back pain. If this happens, stop doing the exercises. Do not do them again unless your doctor says it is okay. This information is not intended to replace advice given to you by your health care provider. Make sure you discuss any questions you have with your health care provider. Document Released: 09/06/2010 Document Revised: 01/10/2016 Document Reviewed: 09/28/2014 Elsevier Interactive Patient Education  [employer].

## 2017-11-09 NOTE — Progress Notes (Signed)
Subjective:    Julie Moody is a 22 y.o. female who presents for evaluation of low back pain.  Patient was evaluated at [facility] on 11/03/2017 for this problem.  She describes back pain as intermittent and aching with occasional back spasms.  She has been taking over-the-counter Tylenol without relief.  She says that her back pain is worsened by prolonged standing.  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  Symptoms are improved by rest.  She denies weakness, fatigue, fever, urinary retention, incontinence, dysuria, or hematuria. Past Medical History:  Diagnosis Date  . Asthma   . Headache(784.0)    Social History   Socioeconomic History  . Marital status: Single    Spouse name: Not on file  . Number of children: Not on file  . Years of education: Not on file  . Highest education level: Not on file  Occupational History  . Not on file  Social Needs  . Financial resource strain: Not on file  . Food insecurity:    Worry: Not on file    Inability: Not on file  . Transportation needs:    Medical: Not on file    Non-medical: Not on file  Tobacco Use  . Smoking status: Never Smoker  . Smokeless tobacco: Never Used  Substance and Sexual Activity  . Alcohol use: Yes    Alcohol/week: 0.0 oz    Comment: occ  . Drug use: Yes    Types: Marijuana    Comment: occ  . Sexual activity: Not Currently    Partners: Male  Lifestyle  . Physical activity:    Days per week: Not on file    Minutes per session: Not on file  . Stress: Not on file  Relationships  . Social connections:    Talks on phone: Not on file    Gets together: Not on file    Attends religious service: Not on file    Active member of club or organization: Not on file    Attends meetings of clubs or organizations: Not on file    Relationship status: Not on file  . Intimate partner violence:    Fear of current or ex partner: Not on file    Emotionally abused: Not on file     Physically abused: Not on file    Forced sexual activity: Not on file  Other Topics Concern  . Not on file  Social History Narrative  . Not on file   Immunization History  Administered Date(s) Administered  . DTaP 08/26/1996, 10/19/1996, 02/03/1997, 11/16/1997, 04/08/2001  . HPV Quadrivalent 03/03/2008, 05/09/2008, 03/09/2009  . Hepatitis A 08/27/2006, 06/24/2007  . Hepatitis B Oct 13, 1995, 07/21/1996, 05/09/1997  . HiB (PRP-OMP) 08/26/1996, 10/19/1996, 02/03/1997, 11/16/1997  . IPV 08/26/1996, 10/19/1996, 07/27/1997, 04/08/2001  . Influenza,inj,Quad PF,6+ Mos 09/07/2014  . Influenza-Unspecified 09/07/2014  . MMR 07/27/1997, 04/08/2001  . Meningococcal Conjugate 03/03/2008  . Meningococcal Polysaccharide 08/27/2012  . Tdap 06/24/2007  . Varicella 07/27/1997  . Zoster 08/27/2006    Objective:   Full range of motion without pain, no tenderness, no spasm, no curvature. Normal reflexes, gait, strength and negative straight-leg raise.    Assessment:  .Physical Exam  Constitutional: She is oriented to person, place, and time. She appears well-developed and well-nourished.  Cardiovascular: Normal rate, regular rhythm, normal heart sounds and intact distal pulses.  Pulmonary/Chest: Effort normal and breath sounds normal.  Abdominal: Soft. Bowel sounds are normal.  Musculoskeletal:  Lumbar back: She exhibits decreased range of motion and tenderness. She exhibits no pain and no spasm.  Neurological: She is alert and oriented to person, place, and time.  Skin: Skin is warm and dry.     Plan:    Midline low back pain without sciatica, unspecified chronicity - ketorolac (TORADOL) 30 MG/ML injection 30 mg - naproxen (NAPROSYN) 500 MG tablet; Take 1 tablet (500 mg total) by mouth 2 (two) times daily with a meal.  Dispense: 30 tablet; Refill: 0 Proper lifting, bending technique discussed. Stretching exercises discussed. Ice to affected area as needed for local pain  relief. Heat to affected area as needed for local pain relief. NSAIDs per medication orders.    Donia Pounds  MSN, FNP-C [facility] [address] [place], [place] [address] [phone]
